# Patient Record
Sex: Male | Born: 1960 | Race: Black or African American | Hispanic: No | Marital: Married | Smoking: Never smoker
Health system: Southern US, Community
[De-identification: ages and names within clinical notes are randomized; demographics above are authoritative.]

## PROBLEM LIST (undated history)

## (undated) DIAGNOSIS — I499 Cardiac arrhythmia, unspecified: Secondary | ICD-10-CM

## (undated) DIAGNOSIS — I10 Essential (primary) hypertension: Secondary | ICD-10-CM

## (undated) HISTORY — DX: Cardiac arrhythmia, unspecified: I49.9

## (undated) HISTORY — PX: BACK SURGERY: SHX140

---

## 2016-08-19 NOTE — Patient Instructions (Addendum)
Marco Chapman  08/19/2016   Your procedure is scheduled on: 08-27-16   Report to [facility]  Entrance Take Marco Chapman Elevators to 3rd floor to Short Stay Center at 05:30 AM.    Call this number if you have problems the morning of surgery [phone]    Remember: ONLY 1 PERSON MAY GO WITH YOU TO SHORT STAY TO GET  READY MORNING OF YOUR SURGERY.  Do not eat food or drink liquids :After Midnight.     Take these medicines the morning of surgery with A SIP OF WATER: None                                 You may not have any metal on your body including hair pins and              piercings  Do not wear jewelry, make-up, lotions, powders or perfumes, deodorant             Men may shave face and neck.   Do not bring valuables to the hospital. [place] IS NOT             RESPONSIBLE   FOR VALUABLES.  Contacts, dentures or bridgework may not be worn into surgery.  Leave suitcase in the car. After surgery it may be brought to your room.     Please read over the following fact sheets you were given: _____________________________________________________________________  Marco Chapman - Preparing for Surgery Before surgery, you can play an important role.  Because skin is not sterile, your skin needs to be as free of germs as possible.  You can reduce the number of germs on your skin by washing with CHG (chlorahexidine gluconate) soap before surgery.  CHG is an antiseptic cleaner which kills germs and bonds with the skin to continue killing germs even after washing. Please DO NOT use if you have an allergy to CHG or antibacterial soaps.  If your skin becomes reddened/irritated stop using the CHG and inform your nurse when you arrive at Short Stay. Do not shave (including legs and underarms) for at least 48 hours prior to the first CHG shower.  You may shave your face/neck. Please follow these instructions carefully:  1.  Shower with CHG Soap the night before surgery  and the  morning of Surgery.  2.  If you choose to wash your hair, wash your hair first as usual with your  normal  shampoo.  3.  After you shampoo, rinse your hair and body thoroughly to remove the  shampoo.                           4.  Use CHG as you would any other liquid soap.  You can apply chg directly  to the skin and wash                       Gently with a scrungie or clean washcloth.  5.  Apply the CHG Soap to your body ONLY FROM THE NECK DOWN.   Do not use on face/ open                           Wound or open sores. Avoid contact with  eyes, ears mouth and genitals (private parts).                       Wash face,  Genitals (private parts) with your normal soap.             6.  Wash thoroughly, paying special attention to the area where your surgery  will be performed.  7.  Thoroughly rinse your body with warm water from the neck down.  8.  DO NOT shower/wash with your normal soap after using and rinsing off  the CHG Soap.                9.  Pat yourself dry with a clean towel.            10.  Wear clean pajamas.            11.  Place clean sheets on your bed the night of your first shower and do not  sleep with pets. Day of Surgery : Do not apply any lotions/deodorants the morning of surgery.  Please wear clean clothes to the hospital/surgery center.  FAILURE TO FOLLOW THESE INSTRUCTIONS MAY RESULT IN THE CANCELLATION OF YOUR SURGERY PATIENT SIGNATURE_________________________________  NURSE SIGNATURE__________________________________  ________________________________________________________________________         Marco MireIncentive Spirometer  An incentive spirometer is a tool that can help keep your lungs clear and active. This tool measures how well you are filling your lungs with each breath. Taking long deep breaths may help reverse or decrease the chance of developing breathing (pulmonary) problems (especially infection) following:  A long period of time when you are unable to  move or be active. BEFORE THE PROCEDURE   If the spirometer includes an indicator to show your best effort, your nurse or respiratory therapist will set it to a desired goal.  If possible, sit up straight or lean slightly forward. Try not to slouch.  Hold the incentive spirometer in an upright position. INSTRUCTIONS FOR USE  1. Sit on the edge of your bed if possible, or sit up as far as you can in bed or on a chair. 2. Hold the incentive spirometer in an upright position. 3. Breathe out normally. 4. Place the mouthpiece in your mouth and seal your lips tightly around it. 5. Breathe in slowly and as deeply as possible, raising the piston or the ball toward the top of the column. 6. Hold your breath for 3-5 seconds or for as long as possible. Allow the piston or ball to fall to the bottom of the column. 7. Remove the mouthpiece from your mouth and breathe out normally. 8. Rest for a few seconds and repeat Steps 1 through 7 at least 10 times every 1-2 hours when you are awake. Take your time and take a few normal breaths between deep breaths. 9. The spirometer may include an indicator to show your best effort. Use the indicator as a goal to work toward during each repetition. 10. After each set of 10 deep breaths, practice coughing to be sure your lungs are clear. If you have an incision (the cut made at the time of surgery), support your incision when coughing by placing a pillow or rolled up towels firmly against it. Once you are able to get out of bed, walk around indoors and cough well. You may stop using the incentive spirometer when instructed by your caregiver.  RISKS AND COMPLICATIONS  Take your time so you do not get dizzy  or light-headed.  If you are in pain, you may need to take or ask for pain medication before doing incentive spirometry. It is harder to take a deep breath if you are having pain. AFTER USE  Rest and breathe slowly and easily.  It can be helpful to keep track of  a log of your progress. Your caregiver can provide you with a simple table to help with this. If you are using the spirometer at home, follow these instructions: SEEK MEDICAL CARE IF:   You are having difficultly using the spirometer.  You have trouble using the spirometer as often as instructed.  Your pain medication is not giving enough relief while using the spirometer.  You develop fever of 100.5 F (38.1 C) or higher. SEEK IMMEDIATE MEDICAL CARE IF:   You cough up bloody sputum that had not been present before.  You develop fever of 102 F (38.9 C) or greater.  You develop worsening pain at or near the incision site. MAKE SURE YOU:   Understand these instructions.  Will watch your condition.  Will get help right away if you are not doing well or get worse. Document Released: 07/19/2006 Document Revised: 05/31/2011 Document Reviewed: 09/19/2006 [facility] Patient Information 2014 [place], [place].   ________________________________________________________________________

## 2016-08-20 ENCOUNTER — Encounter (HOSPITAL_COMMUNITY)
Admission: RE | Admit: 2016-08-20 | Discharge: 2016-08-20 | Disposition: A | Discharge status: Home / Self Care | Payer: BLUE CROSS/BLUE SHIELD | Source: Ambulatory Visit | Attending: Orthopedic Surgery | Admitting: Orthopedic Surgery

## 2016-08-20 ENCOUNTER — Encounter (HOSPITAL_COMMUNITY): Payer: Self-pay

## 2016-08-20 ENCOUNTER — Ambulatory Visit (HOSPITAL_COMMUNITY)
Admission: RE | Admit: 2016-08-20 | Discharge: 2016-08-20 | Disposition: A | Discharge status: Home / Self Care | Payer: BLUE CROSS/BLUE SHIELD | Source: Ambulatory Visit | Attending: Surgical | Admitting: Surgical

## 2016-08-20 DIAGNOSIS — M5126 Other intervertebral disc displacement, lumbar region: Secondary | ICD-10-CM | POA: Diagnosis not present

## 2016-08-20 DIAGNOSIS — Z01818 Encounter for other preprocedural examination: Secondary | ICD-10-CM | POA: Insufficient documentation

## 2016-08-20 DIAGNOSIS — M545 Low back pain, unspecified: Secondary | ICD-10-CM

## 2016-08-20 DIAGNOSIS — M5136 Other intervertebral disc degeneration, lumbar region: Secondary | ICD-10-CM | POA: Diagnosis not present

## 2016-08-20 DIAGNOSIS — I517 Cardiomegaly: Secondary | ICD-10-CM | POA: Diagnosis not present

## 2016-08-20 DIAGNOSIS — M48061 Spinal stenosis, lumbar region without neurogenic claudication: Secondary | ICD-10-CM | POA: Insufficient documentation

## 2016-08-20 LAB — URINALYSIS, ROUTINE W REFLEX MICROSCOPIC
Bilirubin Urine: NEGATIVE
Glucose, UA: NEGATIVE mg/dL
Hgb urine dipstick: NEGATIVE
Ketones, ur: NEGATIVE mg/dL
Leukocytes, UA: NEGATIVE
Nitrite: NEGATIVE
Protein, ur: NEGATIVE mg/dL
Specific Gravity, Urine: 1.012 (ref 1.005–1.030)
pH: 5 (ref 5.0–8.0)

## 2016-08-20 LAB — COMPREHENSIVE METABOLIC PANEL
ALT: 31 U/L (ref 17–63)
AST: 28 U/L (ref 15–41)
Albumin: 4.1 g/dL (ref 3.5–5.0)
Alkaline Phosphatase: 59 U/L (ref 38–126)
Anion gap: 8 (ref 5–15)
BUN: 12 mg/dL (ref 6–20)
CO2: 29 mmol/L (ref 22–32)
Calcium: 9.4 mg/dL (ref 8.9–10.3)
Chloride: 103 mmol/L (ref 101–111)
Creatinine, Ser: 1.08 mg/dL (ref 0.61–1.24)
GFR calc Af Amer: >60 mL/min (ref >60)
GFR calc non Af Amer: >60 mL/min (ref >60)
Glucose, Bld: 99 mg/dL (ref 65–99)
Potassium: 4.1 mmol/L (ref 3.5–5.1)
Sodium: 140 mmol/L (ref 135–145)
Total Bilirubin: 0.5 mg/dL (ref 0.3–1.2)
Total Protein: 7.6 g/dL (ref 6.5–8.1)

## 2016-08-20 LAB — CBC WITH DIFFERENTIAL/PLATELET
Basophils Absolute: 0 10*3/uL (ref 0.0–0.1)
Basophils Relative: 1 %
Eosinophils Absolute: 0.2 10*3/uL (ref 0.0–0.7)
Eosinophils Relative: 4 %
HCT: 44.1 % (ref 39.0–52.0)
Hemoglobin: 14.8 g/dL (ref 13.0–17.0)
Lymphocytes Relative: 43 %
Lymphs Abs: 1.9 10*3/uL (ref 0.7–4.0)
MCH: 29.8 pg (ref 26.0–34.0)
MCHC: 33.6 g/dL (ref 30.0–36.0)
MCV: 88.9 fL (ref 78.0–100.0)
Monocytes Absolute: 0.6 10*3/uL (ref 0.1–1.0)
Monocytes Relative: 14 %
Neutro Abs: 1.6 10*3/uL — ABNORMAL LOW (ref 1.7–7.7)
Neutrophils Relative %: 38 %
Platelets: 184 10*3/uL (ref 150–400)
RBC: 4.96 MIL/uL (ref 4.22–5.81)
RDW: 13 % (ref 11.5–15.5)
WBC: 4.3 10*3/uL (ref 4.0–10.5)

## 2016-08-20 LAB — PROTIME-INR
INR: 0.94
Prothrombin Time: 12.5 seconds (ref 11.4–15.2)

## 2016-08-20 LAB — APTT: aPTT: 27 seconds (ref 24–36)

## 2016-08-20 LAB — SURGICAL PCR SCREEN
MRSA, PCR: NEGATIVE
Staphylococcus aureus: NEGATIVE

## 2016-08-26 ENCOUNTER — Encounter (HOSPITAL_COMMUNITY): Payer: Self-pay | Admitting: Anesthesiology

## 2016-08-26 NOTE — Anesthesia Preprocedure Evaluation (Addendum)
Anesthesia Evaluation  Patient identified by MRN, date of birth, ID band Patient awake    Reviewed: Allergy & Precautions, NPO status , Patient's Chart, lab work & pertinent test results  Airway Mallampati: I       Dental no notable dental hx. (+) Teeth Intact   Pulmonary    Pulmonary exam normal breath sounds clear to auscultation       Cardiovascular hypertension, Pt. on medications Normal cardiovascular exam Rhythm:Regular Rate:Normal     Neuro/Psych negative neurological ROS  negative psych ROS   GI/Hepatic negative GI ROS, Neg liver ROS,   Endo/Other  negative endocrine ROS  Renal/GU negative Renal ROS     Musculoskeletal   Abdominal Normal abdominal exam  (+)   Peds  Hematology negative hematology ROS (+)   Anesthesia Other Findings   Reproductive/Obstetrics                            Anesthesia Physical Anesthesia Plan  ASA: II  Anesthesia Plan: General   Post-op Pain Management:    Induction: Intravenous  PONV Risk Score and Plan: 3 and Ondansetron, Dexamethasone, Propofol, Midazolam and Treatment may vary due to age  Airway Management Planned: Oral ETT  Additional Equipment:   Intra-op Plan:   Post-operative Plan: Extubation in OR  Informed Consent: I have reviewed the patients History and Physical, chart, labs and discussed the procedure including the risks, benefits and alternatives for the proposed anesthesia with the patient or authorized representative who has indicated his/her understanding and acceptance.   Dental advisory given  Plan Discussed with: CRNA and Surgeon  Anesthesia Plan Comments:        Anesthesia Quick Evaluation

## 2016-08-27 ENCOUNTER — Encounter (HOSPITAL_COMMUNITY): Payer: Self-pay

## 2016-08-27 ENCOUNTER — Observation Stay (HOSPITAL_COMMUNITY)
Admission: RE | Admit: 2016-08-27 | Discharge: 2016-08-28 | Disposition: A | Discharge status: Home / Self Care | Payer: BLUE CROSS/BLUE SHIELD | Source: Ambulatory Visit | Attending: Orthopedic Surgery | Admitting: Orthopedic Surgery

## 2016-08-27 ENCOUNTER — Ambulatory Visit (HOSPITAL_COMMUNITY): Payer: BLUE CROSS/BLUE SHIELD | Admitting: Anesthesiology

## 2016-08-27 ENCOUNTER — Ambulatory Visit (HOSPITAL_COMMUNITY): Payer: BLUE CROSS/BLUE SHIELD

## 2016-08-27 ENCOUNTER — Encounter (HOSPITAL_COMMUNITY)
Admission: RE | Disposition: A | Discharge status: Home / Self Care | Payer: Self-pay | Source: Ambulatory Visit | Attending: Orthopedic Surgery

## 2016-08-27 DIAGNOSIS — R531 Weakness: Secondary | ICD-10-CM | POA: Diagnosis not present

## 2016-08-27 DIAGNOSIS — M5127 Other intervertebral disc displacement, lumbosacral region: Secondary | ICD-10-CM | POA: Diagnosis not present

## 2016-08-27 DIAGNOSIS — M4807 Spinal stenosis, lumbosacral region: Secondary | ICD-10-CM | POA: Diagnosis not present

## 2016-08-27 DIAGNOSIS — Z79899 Other long term (current) drug therapy: Secondary | ICD-10-CM | POA: Insufficient documentation

## 2016-08-27 DIAGNOSIS — M79604 Pain in right leg: Secondary | ICD-10-CM | POA: Diagnosis present

## 2016-08-27 DIAGNOSIS — I10 Essential (primary) hypertension: Secondary | ICD-10-CM | POA: Insufficient documentation

## 2016-08-27 DIAGNOSIS — M48062 Spinal stenosis, lumbar region with neurogenic claudication: Secondary | ICD-10-CM | POA: Diagnosis present

## 2016-08-27 DIAGNOSIS — M549 Dorsalgia, unspecified: Secondary | ICD-10-CM

## 2016-08-27 HISTORY — PX: LUMBAR LAMINECTOMY/DECOMPRESSION MICRODISCECTOMY: SHX5026

## 2016-08-27 SURGERY — LUMBAR LAMINECTOMY/DECOMPRESSION MICRODISCECTOMY
Anesthesia: General | Site: Back

## 2016-08-27 MED ORDER — HYDROCODONE-ACETAMINOPHEN 5-325 MG PO TABS
1.0000 | ORAL_TABLET | ORAL | Status: DC | PRN
  Administered 2016-08-27 (×2): 1 via ORAL
  Filled 2016-08-27 (×2): qty 1

## 2016-08-27 MED ORDER — SODIUM CHLORIDE 0.9 % IR SOLN
Status: AC
  Filled 2016-08-27: qty 500000

## 2016-08-27 MED ORDER — POLYETHYLENE GLYCOL 3350 17 G PO PACK: 17.0000 g | PACK | Freq: Every day | ORAL | Status: DC | PRN

## 2016-08-27 MED ORDER — SUGAMMADEX SODIUM 200 MG/2ML IV SOLN
INTRAVENOUS | Status: AC
  Filled 2016-08-27: qty 2

## 2016-08-27 MED ORDER — ONDANSETRON HCL 4 MG/2ML IJ SOLN
INTRAMUSCULAR | Status: DC | PRN
  Administered 2016-08-27: 4 mg via INTRAVENOUS

## 2016-08-27 MED ORDER — ACETAMINOPHEN 650 MG RE SUPP: 650.0000 mg | RECTAL | Status: DC | PRN

## 2016-08-27 MED ORDER — LABETALOL HCL 5 MG/ML IV SOLN
INTRAVENOUS | Status: AC
  Filled 2016-08-27: qty 4

## 2016-08-27 MED ORDER — BACITRACIN ZINC 500 UNIT/GM EX OINT
TOPICAL_OINTMENT | CUTANEOUS | Status: AC
  Filled 2016-08-27: qty 28.35

## 2016-08-27 MED ORDER — BUPIVACAINE-EPINEPHRINE (PF) 0.5% -1:200000 IJ SOLN
INTRAMUSCULAR | Status: DC | PRN
  Administered 2016-08-27: 20 mL

## 2016-08-27 MED ORDER — CHLORHEXIDINE GLUCONATE 4 % EX LIQD: 60.0000 mL | Freq: Once | CUTANEOUS | Status: DC

## 2016-08-27 MED ORDER — PHENYLEPHRINE 40 MCG/ML (10ML) SYRINGE FOR IV PUSH (FOR BLOOD PRESSURE SUPPORT)
PREFILLED_SYRINGE | INTRAVENOUS | Status: AC
  Filled 2016-08-27: qty 10

## 2016-08-27 MED ORDER — ONDANSETRON HCL 4 MG/2ML IJ SOLN
INTRAMUSCULAR | Status: AC
  Filled 2016-08-27: qty 2

## 2016-08-27 MED ORDER — CEFAZOLIN SODIUM-DEXTROSE 1-4 GM/50ML-% IV SOLN
1.0000 g | Freq: Three times a day (TID) | INTRAVENOUS | Status: AC
  Administered 2016-08-27 – 2016-08-28 (×3): 1 g via INTRAVENOUS
  Filled 2016-08-27 (×3): qty 50

## 2016-08-27 MED ORDER — METHOCARBAMOL 500 MG PO TABS: 500.0000 mg | ORAL_TABLET | Freq: Three times a day (TID) | ORAL | 1 refills | Status: DC | PRN

## 2016-08-27 MED ORDER — ROCURONIUM BROMIDE 50 MG/5ML IV SOSY
PREFILLED_SYRINGE | INTRAVENOUS | Status: AC
  Filled 2016-08-27: qty 5

## 2016-08-27 MED ORDER — AMLODIPINE BESYLATE 10 MG PO TABS
10.0000 mg | ORAL_TABLET | Freq: Every evening | ORAL | Status: DC
  Administered 2016-08-27: 10 mg via ORAL
  Filled 2016-08-27: qty 1

## 2016-08-27 MED ORDER — HYDROMORPHONE HCL 1 MG/ML IJ SOLN: 0.2500 mg | INTRAMUSCULAR | Status: DC | PRN

## 2016-08-27 MED ORDER — DEXTROSE 5 % IV SOLN
500.0000 mg | Freq: Four times a day (QID) | INTRAVENOUS | Status: DC | PRN
  Administered 2016-08-27: 500 mg via INTRAVENOUS
  Filled 2016-08-27: qty 550

## 2016-08-27 MED ORDER — BISACODYL 5 MG PO TBEC: 5.0000 mg | DELAYED_RELEASE_TABLET | Freq: Every day | ORAL | Status: DC | PRN

## 2016-08-27 MED ORDER — ONDANSETRON HCL 4 MG PO TABS
4.0000 mg | ORAL_TABLET | Freq: Four times a day (QID) | ORAL | Status: DC | PRN
Start: 2016-08-27 — End: 2016-08-28

## 2016-08-27 MED ORDER — SODIUM CHLORIDE 0.9 % IR SOLN
Status: DC | PRN
  Administered 2016-08-27: 500 mL

## 2016-08-27 MED ORDER — MIDAZOLAM HCL 2 MG/2ML IJ SOLN
INTRAMUSCULAR | Status: DC | PRN
  Administered 2016-08-27: 2 mg via INTRAVENOUS

## 2016-08-27 MED ORDER — OXYCODONE-ACETAMINOPHEN 5-325 MG PO TABS
2.0000 | ORAL_TABLET | ORAL | Status: DC | PRN
  Administered 2016-08-28: 2 via ORAL
  Filled 2016-08-27: qty 2

## 2016-08-27 MED ORDER — FLEET ENEMA 7-19 GM/118ML RE ENEM: 1.0000 | ENEMA | Freq: Once | RECTAL | Status: DC | PRN

## 2016-08-27 MED ORDER — FENTANYL CITRATE (PF) 250 MCG/5ML IJ SOLN
INTRAMUSCULAR | Status: AC
  Filled 2016-08-27: qty 5

## 2016-08-27 MED ORDER — PHENYLEPHRINE HCL 10 MG/ML IJ SOLN
INTRAMUSCULAR | Status: DC | PRN
  Administered 2016-08-27 (×2): 80 ug via INTRAVENOUS
  Administered 2016-08-27: 40 ug via INTRAVENOUS

## 2016-08-27 MED ORDER — PROPOFOL 10 MG/ML IV BOLUS
INTRAVENOUS | Status: AC
  Filled 2016-08-27: qty 20

## 2016-08-27 MED ORDER — HYDROMORPHONE HCL 1 MG/ML IJ SOLN
0.5000 mg | INTRAMUSCULAR | Status: DC | PRN
  Administered 2016-08-28 (×2): 0.5 mg via INTRAVENOUS
  Filled 2016-08-27 (×2): qty 0.5

## 2016-08-27 MED ORDER — BUPIVACAINE LIPOSOME 1.3 % IJ SUSP
20.0000 mL | Freq: Once | INTRAMUSCULAR | Status: DC
  Filled 2016-08-27: qty 20

## 2016-08-27 MED ORDER — FENTANYL CITRATE (PF) 250 MCG/5ML IJ SOLN
INTRAMUSCULAR | Status: DC | PRN
  Administered 2016-08-27: 100 ug via INTRAVENOUS
  Administered 2016-08-27 (×3): 50 ug via INTRAVENOUS

## 2016-08-27 MED ORDER — ACETAMINOPHEN 10 MG/ML IV SOLN
INTRAVENOUS | Status: AC
  Filled 2016-08-27: qty 100

## 2016-08-27 MED ORDER — LIDOCAINE 2% (20 MG/ML) 5 ML SYRINGE
INTRAMUSCULAR | Status: AC
  Filled 2016-08-27: qty 5

## 2016-08-27 MED ORDER — SUCCINYLCHOLINE CHLORIDE 200 MG/10ML IV SOSY
PREFILLED_SYRINGE | INTRAVENOUS | Status: AC
  Filled 2016-08-27: qty 10

## 2016-08-27 MED ORDER — ROCURONIUM BROMIDE 50 MG/5ML IV SOSY
PREFILLED_SYRINGE | INTRAVENOUS | Status: DC | PRN
  Administered 2016-08-27: 10 mg via INTRAVENOUS
  Administered 2016-08-27: 50 mg via INTRAVENOUS

## 2016-08-27 MED ORDER — SUCCINYLCHOLINE CHLORIDE 200 MG/10ML IV SOSY
PREFILLED_SYRINGE | INTRAVENOUS | Status: DC | PRN
Start: 2016-08-27 — End: 2016-08-27
  Administered 2016-08-27: 120 mg via INTRAVENOUS

## 2016-08-27 MED ORDER — ACETAMINOPHEN 325 MG PO TABS: 650.0000 mg | ORAL_TABLET | ORAL | Status: DC | PRN

## 2016-08-27 MED ORDER — LACTATED RINGERS IV SOLN
INTRAVENOUS | Status: DC
  Administered 2016-08-27 (×2): via INTRAVENOUS

## 2016-08-27 MED ORDER — HYDROMORPHONE HCL 1 MG/ML IJ SOLN
INTRAMUSCULAR | Status: AC
  Filled 2016-08-27: qty 1

## 2016-08-27 MED ORDER — DEXAMETHASONE SODIUM PHOSPHATE 10 MG/ML IJ SOLN
INTRAMUSCULAR | Status: AC
  Filled 2016-08-27: qty 1

## 2016-08-27 MED ORDER — ONDANSETRON HCL 4 MG/2ML IJ SOLN
4.0000 mg | Freq: Four times a day (QID) | INTRAMUSCULAR | Status: DC | PRN
Start: 2016-08-27 — End: 2016-08-28

## 2016-08-27 MED ORDER — CEFAZOLIN SODIUM-DEXTROSE 2-4 GM/100ML-% IV SOLN
2.0000 g | INTRAVENOUS | Status: AC
  Administered 2016-08-27: 2 g via INTRAVENOUS

## 2016-08-27 MED ORDER — MIDAZOLAM HCL 2 MG/2ML IJ SOLN
INTRAMUSCULAR | Status: AC
  Filled 2016-08-27: qty 2

## 2016-08-27 MED ORDER — ACETAMINOPHEN 10 MG/ML IV SOLN
1000.0000 mg | Freq: Once | INTRAVENOUS | Status: AC
  Administered 2016-08-27: 1000 mg via INTRAVENOUS

## 2016-08-27 MED ORDER — LISINOPRIL 5 MG PO TABS
5.0000 mg | ORAL_TABLET | Freq: Every evening | ORAL | Status: DC
  Administered 2016-08-27: 5 mg via ORAL
  Filled 2016-08-27: qty 1

## 2016-08-27 MED ORDER — MEPERIDINE HCL 50 MG/ML IJ SOLN: 6.2500 mg | INTRAMUSCULAR | Status: DC | PRN

## 2016-08-27 MED ORDER — KETOROLAC TROMETHAMINE 30 MG/ML IJ SOLN: 30.0000 mg | Freq: Once | INTRAMUSCULAR | Status: DC | PRN

## 2016-08-27 MED ORDER — DEXAMETHASONE SODIUM PHOSPHATE 10 MG/ML IJ SOLN
INTRAMUSCULAR | Status: DC | PRN
  Administered 2016-08-27: 10 mg via INTRAVENOUS

## 2016-08-27 MED ORDER — OXYCODONE-ACETAMINOPHEN 5-325 MG PO TABS: 1.0000 | ORAL_TABLET | ORAL | 0 refills | Status: DC | PRN

## 2016-08-27 MED ORDER — MENTHOL 3 MG MT LOZG: 1.0000 | LOZENGE | OROMUCOSAL | Status: DC | PRN

## 2016-08-27 MED ORDER — PROPOFOL 10 MG/ML IV BOLUS
INTRAVENOUS | Status: DC | PRN
  Administered 2016-08-27: 200 mg via INTRAVENOUS

## 2016-08-27 MED ORDER — PROMETHAZINE HCL 25 MG/ML IJ SOLN: 6.2500 mg | INTRAMUSCULAR | Status: DC | PRN

## 2016-08-27 MED ORDER — PHENOL 1.4 % MT LIQD: 1.0000 | OROMUCOSAL | Status: DC | PRN

## 2016-08-27 MED ORDER — CEFAZOLIN SODIUM-DEXTROSE 2-4 GM/100ML-% IV SOLN
INTRAVENOUS | Status: AC
  Filled 2016-08-27: qty 100

## 2016-08-27 MED ORDER — METHOCARBAMOL 500 MG PO TABS: 500.0000 mg | ORAL_TABLET | Freq: Four times a day (QID) | ORAL | Status: DC | PRN

## 2016-08-27 MED ORDER — BUPIVACAINE-EPINEPHRINE (PF) 0.5% -1:200000 IJ SOLN
INTRAMUSCULAR | Status: AC
  Filled 2016-08-27: qty 30

## 2016-08-27 MED ORDER — LIDOCAINE 2% (20 MG/ML) 5 ML SYRINGE
INTRAMUSCULAR | Status: DC | PRN
  Administered 2016-08-27: 100 mg via INTRAVENOUS

## 2016-08-27 MED ORDER — LACTATED RINGERS IV SOLN
INTRAVENOUS | Status: DC
  Administered 2016-08-27 (×2): via INTRAVENOUS

## 2016-08-27 MED ORDER — BUPIVACAINE LIPOSOME 1.3 % IJ SUSP
INTRAMUSCULAR | Status: DC | PRN
  Administered 2016-08-27: 20 mL

## 2016-08-27 MED ORDER — SUGAMMADEX SODIUM 200 MG/2ML IV SOLN
INTRAVENOUS | Status: DC | PRN
  Administered 2016-08-27: 200 mg via INTRAVENOUS

## 2016-08-27 SURGICAL SUPPLY — 47 items
BAG ZIPLOCK 12X15 (MISCELLANEOUS) ×3 IMPLANT
BENZOIN TINCTURE PRP APPL 2/3 (GAUZE/BANDAGES/DRESSINGS) ×3 IMPLANT
CLEANER TIP ELECTROSURG 2X2 (MISCELLANEOUS) ×3 IMPLANT
COVER SURGICAL LIGHT HANDLE (MISCELLANEOUS) ×3 IMPLANT
DRAIN PENROSE 18X1/4 LTX STRL (WOUND CARE) IMPLANT
DRAPE MICROSCOPE LEICA (MISCELLANEOUS) ×3 IMPLANT
DRAPE POUCH INSTRU U-SHP 10X18 (DRAPES) IMPLANT
DRAPE SHEET LG 3/4 BI-LAMINATE (DRAPES) ×3 IMPLANT
DRAPE SURG 17X11 SM STRL (DRAPES) ×3 IMPLANT
DRSG ADAPTIC 3X8 NADH LF (GAUZE/BANDAGES/DRESSINGS) ×3 IMPLANT
DRSG PAD ABDOMINAL 8X10 ST (GAUZE/BANDAGES/DRESSINGS) ×12 IMPLANT
DURAPREP 26ML APPLICATOR (WOUND CARE) ×3 IMPLANT
ELECT BLADE TIP CTD 4 INCH (ELECTRODE) ×3 IMPLANT
ELECT REM PT RETURN 15FT ADLT (MISCELLANEOUS) ×3 IMPLANT
GAUZE SPONGE 4X4 12PLY STRL (GAUZE/BANDAGES/DRESSINGS) ×3 IMPLANT
GLOVE BIOGEL PI IND STRL 6.5 (GLOVE) ×1 IMPLANT
GLOVE BIOGEL PI IND STRL 8.5 (GLOVE) ×1 IMPLANT
GLOVE BIOGEL PI INDICATOR 6.5 (GLOVE) ×2
GLOVE BIOGEL PI INDICATOR 8.5 (GLOVE) ×2
GLOVE ECLIPSE 8.0 STRL XLNG CF (GLOVE) ×6 IMPLANT
GLOVE INDICATOR 6.5 STRL GRN (GLOVE) ×3 IMPLANT
GOWN STRL REUS W/ TWL LRG LVL3 (GOWN DISPOSABLE) ×1 IMPLANT
GOWN STRL REUS W/TWL LRG LVL3 (GOWN DISPOSABLE) ×2
GOWN STRL REUS W/TWL XL LVL3 (GOWN DISPOSABLE) ×3 IMPLANT
HEMOSTAT SPONGE AVITENE ULTRA (HEMOSTASIS) ×3 IMPLANT
KIT BASIN OR (CUSTOM PROCEDURE TRAY) ×3 IMPLANT
KIT POSITIONING SURG ANDREWS (MISCELLANEOUS) ×3 IMPLANT
MANIFOLD NEPTUNE II (INSTRUMENTS) ×3 IMPLANT
MARKER SKIN DUAL TIP RULER LAB (MISCELLANEOUS) IMPLANT
NEEDLE HYPO 22GX1.5 SAFETY (NEEDLE) ×3 IMPLANT
NEEDLE SPNL 18GX3.5 QUINCKE PK (NEEDLE) ×9 IMPLANT
PACK LAMINECTOMY ORTHO (CUSTOM PROCEDURE TRAY) ×3 IMPLANT
PATTIES SURGICAL .5 X.5 (GAUZE/BANDAGES/DRESSINGS) IMPLANT
PATTIES SURGICAL .75X.75 (GAUZE/BANDAGES/DRESSINGS) ×3 IMPLANT
PATTIES SURGICAL 1X1 (DISPOSABLE) IMPLANT
PIN SAFETY NICK PLATE  2 MED (MISCELLANEOUS)
PIN SAFETY NICK PLATE 2 MED (MISCELLANEOUS) IMPLANT
SPONGE LAP 4X18 X RAY DECT (DISPOSABLE) ×6 IMPLANT
STAPLER VISISTAT 35W (STAPLE) ×3 IMPLANT
SUT VIC AB 0 CT1 27 (SUTURE) ×2
SUT VIC AB 0 CT1 27XBRD ANTBC (SUTURE) ×1 IMPLANT
SUT VIC AB 1 CT1 27 (SUTURE) ×6
SUT VIC AB 1 CT1 27XBRD ANTBC (SUTURE) ×3 IMPLANT
SUT VIC AB 2-0 CT1 27 (SUTURE) ×2
SUT VIC AB 2-0 CT1 TAPERPNT 27 (SUTURE) ×1 IMPLANT
SYR 20CC LL (SYRINGE) ×3 IMPLANT
TOWEL OR 17X26 10 PK STRL BLUE (TOWEL DISPOSABLE) ×3 IMPLANT

## 2016-08-27 NOTE — Anesthesia Procedure Notes (Signed)
Procedure Name: Intubation Date/Time: 08/27/2016 7:27 AM Performed by: Dione Booze Pre-anesthesia Checklist: Emergency Drugs available, Suction available, Patient being monitored and Patient identified Patient Re-evaluated:Patient Re-evaluated prior to inductionOxygen Delivery Method: Circle system utilized Preoxygenation: Pre-oxygenation with 100% oxygen Intubation Type: IV induction Ventilation: Mask ventilation without difficulty Laryngoscope Size: Mac and 4 Grade View: Grade II Tube type: Oral Tube size: 7.5 mm Number of attempts: 1 Airway Equipment and Method: Stylet Placement Confirmation: ETT inserted through vocal cords under direct vision,  positive ETCO2 and breath sounds checked- equal and bilateral Secured at: 23 cm Tube secured with: Tape Dental Injury: Teeth and Oropharynx as per pre-operative assessment

## 2016-08-27 NOTE — Discharge Instructions (Signed)
For the first fhree days, remove your dressing, tape a piece of saran wrap over your incision,. Take your shower, then remove the saran wrap and put a clean dressing on. After three days you can shower without the saran wrap.  No lifting or bending. No driving while taking pain medications. Call Dr. [name] if any wound complications or temperature of 101 degrees F or over.  Call the office for an appointment to see Dr. [name] in two weeks: [phone] and ask for Dr. Jeannetta EllisGioffre's nurse, Mackey Birchwoodammy Johnson.

## 2016-08-27 NOTE — Op Note (Signed)
NAMEWALKER, Marco Chapman             ACCOUNT NO.:  [id number]  MEDICAL RECORD NO.:  [id number]  LOCATION:  [facility]                         FACILITY:  [facility]  PHYSICIAN:  Georges Lynch. Dekisha Mesmer, M.D.DATE OF BIRTH:  [birth year]  DATE OF PROCEDURE:  08/27/2016 DATE OF DISCHARGE:                              OPERATIVE REPORT   SURGEON:  Georges Lynch. Darrelyn Hillock, M.D.  OPERATIVE ASSISTANT:  Dimitri Ped, PA.  PREOPERATIVE DIAGNOSES: 1. Spinal stenosis at L5-S1. 2. 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 3. 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  Note, all of his     symptoms were on the right. 4. Partial weakness of the extensors of the right foot.  DESCRIPTION OF PROCEDURE:  Under general anesthesia, routine orthopedic prep and draping of the lower back is carried out with the patient on spinal frame.  The appropriate time-out was carried out, also marked the right side of his back in the holding area.  At this time, he had 2 g of IV Ancef.  Two needles were then placed in the back for localization purposes.  X-ray was taken.  Following that, an incision was made over the L5-S1 interspace.  I went central at this time.  I then separated the muscle from the lamina and spinous process bilaterally.  A Kocher clamp was placed on the spinous process, x-ray was taken.  Following that, I then inserted McCullough retractors.  I went down and cleared the interlaminar space on both sides.  I then began a central decompressive lumbar laminectomy in the usual fashion.  The facets were preserved.  At this time, then the microscope was brought in.  I then carried out my laminectomy mainly to the right.  I went out and decompressed the lateral recess, also went down and did a foraminotomy for the S1 root.  At this time, I gently retracted the dura, identified the S1 root, gradually retracted that as well.  I identified the disc space at L5-S1.  Another x-ray was taken.  At this point, we  then utilized a bipolar and cauterized lateral recess veins.  I then placed a needle in the disc space, removed that.  I then made a cruciate incision in the posterior longitudinal ligament and did a microdiskectomy in the usual fashion.  I continued the microdiskectomy until there was no further disk material were able to be removed.  I then went up under the root of S1 and up above L5 and we now had complete decompression.  The dura and root were easily movable.  I thoroughly irrigated out the area, loosely applied some Ultrafoam and Gelfoam and closed the wound in layers in the usual fashion.  At the beginning of the case, I injected 20 mL of 0.5% Marcaine with epinephrine to control bleeding.  At the end of the case, I injected 20 mL of Exparel into the soft tissue.  When I closed the deep part of the wound, I left a small proximal and distal part of the deep part of the wound for drainage purposes.  Subcu was closed in usual fashion.  Skin was closed with metal staples.  Sterile dressings were applied.  The patient  left the operating room in satisfactory condition.  He will be admitted overnight.          ______________________________ Georges Lynchonald A. [name], M.D.     RAG/MEDQ  D:  08/27/2016  T:  08/27/2016  Job:  [id number]

## 2016-08-27 NOTE — Brief Op Note (Signed)
08/27/2016  8:47 AM  PATIENT:  Dorise Hissurtis L Schow  56 y.o. male  PRE-OPERATIVE DIAGNOSIS:  Right L5-S1 HNPand Spinal Stenosis and Foraminal Stenosis of the S-1 Nerve Root.  POST-OPERATIVE DIAGNOSIS:Same as Pre-Op  PROCEDURE:  Procedure(s): Decompression lumbar laminectomy L5-S1 right and microdisectomy L5-S1 right , forninotomy S1 root on the right (N/A)for SPINAL stenosis and HNP.  SURGEON:  Surgeon(s) and Role:    * Ranee GosselinGioffre, Demontae Antunes, MD - Primary  PHYSICIAN ASSISTANT: [name] PA  ASSISTANTS: [name] PA  ANESTHESIA:   general  EBL:  Total I/O In: -  Out: 25 [Blood:25]  BLOOD ADMINISTERED:none  DRAINS: none   LOCAL MEDICATIONS USED:  MARCAINE  20cc of 0.5% with Epinephrine at start of the case and 20cc of Exparel at the end of the case.   SPECIMEN:  No Specimen  DISPOSITION OF SPECIMEN:  N/A  COUNTS:  YES  TOURNIQUET:  * No tourniquets in log *  DICTATION: .Other Dictation: Dictation Number [id number]  PLAN OF CARE: Admit for overnight observation  PATIENT DISPOSITION:  PACU - hemodynamically stable.   Delay start of Pharmacological VTE agent (>24hrs) due to surgical blood loss or risk of bleeding: yes

## 2016-08-27 NOTE — Interval H&P Note (Signed)
History and Physical Interval Note:  08/27/2016 7:17 AM  Marco Chapman  has presented today for surgery, with the diagnosis of Right L5-S1 HNP  The various methods of treatment have been discussed with the patient and family. After consideration of risks, benefits and other options for treatment, the patient has consented to  Procedure(s): Decompression lumbar laminectomy L5-S1 right and microdisectomy L5-S1 right (N/A) as a surgical intervention .  The patient's history has been reviewed, patient examined, no change in status, stable for surgery.  I have reviewed the patient's chart and labs.  Questions were answered to the patient's satisfaction.     [name] A   

## 2016-08-27 NOTE — H&P (Signed)
Marco Chapman is an 56 y.o. male.   Chief Complaint: Right leg Pain. HPI: Progressive pain in his right leg with weakness.  History reviewed. No pertinent past medical history.  History reviewed. No pertinent surgical history.  History reviewed. No pertinent family history. Social History:  reports that he has never smoked. He has never used smokeless tobacco. He reports that he drinks alcohol. He reports that he does not use drugs.  Allergies: No Known Allergies  Medications Prior to Admission  Medication Sig Dispense Refill  . amLODipine (NORVASC) 10 MG tablet Take 10 mg by mouth every evening.    . calcium carbonate (TUMS - DOSED IN MG ELEMENTAL CALCIUM) 500 MG chewable tablet Chew 2 tablets by mouth daily as needed for indigestion or heartburn.    . Echinacea 400 MG CAPS Take 400 mg by mouth 3 (three) times daily.    . Ibuprofen-Famotidine (DUEXIS) 800-26.6 MG TABS Take 1 tablet by mouth 3 (three) times daily as needed. Pain    . lisinopril (PRINIVIL,ZESTRIL) 5 MG tablet Take 5 mg by mouth every evening.    . Multiple Vitamin (MULTIVITAMIN WITH MINERALS) TABS tablet Take 1 tablet by mouth daily.      No results found for this or any previous visit (from the past 48 hour(s)). No results found.  Review of Systems  Constitutional: Negative.   HENT: Negative.   Eyes: Negative.   Respiratory: Negative.   Cardiovascular: Negative.   Gastrointestinal: Negative.   Genitourinary: Negative.   Musculoskeletal: Positive for back pain.  Skin: Negative.   Neurological: Positive for focal weakness.  Endo/Heme/Allergies: Negative.   Psychiatric/Behavioral: Negative.     Blood pressure (!) 144/100, pulse 97, temperature 98.4 F (36.9 C), temperature source Oral, resp. rate 18, height 5\' 11"  (1.803 m), weight 93.4 kg (206 lb), SpO2 100 %. Physical Exam  Constitutional: He appears well-developed.  HENT:  Head: Normocephalic.  Eyes: Pupils are equal, round, and reactive to light.   Neck: Normal range of motion.  Cardiovascular: Normal rate.   Respiratory: Effort normal.  GI: Soft.  Musculoskeletal: He exhibits tenderness.  Neurological:  Weakness of right foot Dorsiflexors.  Skin: Skin is warm.  Psychiatric: He has a normal mood and affect.     Assessment/Plan Decompressive Lumbar Laminectomy and Microdiscectomy at L-5-S-1 on the right.  Jacki ConesGIOFFRE,Madalina Rosman A, MD 08/27/2016, 7:13 AM

## 2016-08-27 NOTE — Interval H&P Note (Signed)
History and Physical Interval Note:  08/27/2016 7:17 AM  Marco Chapman  has presented today for surgery, with the diagnosis of Right L5-S1 HNP  The various methods of treatment have been discussed with the patient and family. After consideration of risks, benefits and other options for treatment, the patient has consented to  Procedure(s): Decompression lumbar laminectomy L5-S1 right and microdisectomy L5-S1 right (N/A) as a surgical intervention .  The patient's history has been reviewed, patient examined, no change in status, stable for surgery.  I have reviewed the patient's chart and labs.  Questions were answered to the patient's satisfaction.     Io Dieujuste A

## 2016-08-27 NOTE — Anesthesia Postprocedure Evaluation (Signed)
Anesthesia Post Note  Patient: [name] L Clavel  Procedure(s) Performed: Procedure(s) (LRB): Decompression lumbar laminectomy L5-S1 right and microdisectomy L5-S1 right , forninotomy S1 root on the right (N/A)     Patient location during evaluation: PACU Anesthesia Type: General Level of consciousness: awake Pain management: pain level controlled Vital Signs Assessment: post-procedure vital signs reviewed and stable Respiratory status: spontaneous breathing Cardiovascular status: stable Postop Assessment: no signs of nausea or vomiting Anesthetic complications: no    Last Vitals:  Vitals:   08/27/16 0945 08/27/16 1000  BP: 126/85 (!) 135/97  Pulse: 85 87  Resp: 13 11  Temp:      Last Pain:  Vitals:   08/27/16 1000  TempSrc:   PainSc: 0-No pain   Pain Goal: Patients Stated Pain Goal: 4 (08/27/16 0602)    LLE Sensation: Full sensation (08/27/16 1000)   RLE Sensation: Full sensation (08/27/16 1000)      [name],[name]

## 2016-08-27 NOTE — Evaluation (Signed)
Physical Therapy Evaluation Patient Details Name: MAZI BRAILSFORD MRN: [id number] DOB: 10/27/1960 Today's Date: 08/27/2016   History of Present Illness  Pt s/p L5-S1 lumbar decompression and microdisectomy.    Clinical Impression  Pt s/p back surgery and presents with functional mobility limitations 2* post op pain and back precautions.  Pt should progress to dc home with family assist.    Follow Up Recommendations No PT follow up    Equipment Recommendations  Other (comment) (TBD)    Recommendations for Other Services OT consult     Precautions / Restrictions Precautions Precautions: Back Precaution Booklet Issued: Yes (comment) Precaution Comments: Reviewed back precautions x 2 Restrictions Weight Bearing Restrictions: No      Mobility  Bed Mobility Overal bed mobility: Needs Assistance Bed Mobility: Supine to Sit     Supine to sit: Min assist     General bed mobility comments: cues for correct log roll technique  Transfers Overall transfer level: Needs assistance Equipment used: Rolling walker (2 wheeled) Transfers: Sit to/from Stand Sit to Stand: Min assist         General transfer comment: cues for transition position, adherence to back precautions and use of UEs to self assist  Ambulation/Gait Ambulation/Gait assistance: Min assist;Min guard Ambulation Distance (Feet): 120 Feet Assistive device: Rolling walker (2 wheeled) Gait Pattern/deviations: Step-through pattern;Decreased step length - right;Decreased step length - left;Shuffle Gait velocity: decr Gait velocity interpretation: Below normal speed for age/gender General Gait Details: cues for posture and position from [employer]            Wheelchair Mobility    Modified Rankin (Stroke Patients Only)       Balance Overall balance assessment: Needs assistance Sitting-balance support: No upper extremity supported;Feet supported Sitting balance-Leahy Scale: Good     Standing balance  support: No upper extremity supported Standing balance-Leahy Scale: Fair                               Pertinent Vitals/Pain Pain Assessment: 0-10 Pain Score: 2  Pain Location: back Pain Descriptors / Indicators: Aching;Sore Pain Intervention(s): Limited activity within patient's tolerance;Monitored during session;Premedicated before session;Ice applied    Home Living Family/patient expects to be discharged to:: Private residence Living Arrangements: Spouse/significant other Available Help at Discharge: Family Type of Home: House Home Access: Stairs to enter (from garage) Entrance Stairs-Rails: None Secretary/administrator of Steps: 2 Home Layout: Two level;Able to live on main level with bedroom/bathroom Home Equipment: None      Prior Function Level of Independence: Independent               Hand Dominance        Extremity/Trunk Assessment   Upper Extremity Assessment Upper Extremity Assessment: Overall WFL for tasks assessed    Lower Extremity Assessment Lower Extremity Assessment: RLE deficits/detail RLE Deficits / Details: Pt reports R LE pain with ambulation    Cervical / Trunk Assessment Cervical / Trunk Assessment: Normal  Communication   Communication: No difficulties  Cognition Arousal/Alertness: Awake/alert Behavior During Therapy: WFL for tasks assessed/performed Overall Cognitive Status: Within Functional Limits for tasks assessed                                        General Comments      Exercises     Assessment/Plan  PT Assessment Patient needs continued PT services  PT Problem List Decreased activity tolerance;Decreased mobility;Decreased balance;Decreased knowledge of use of DME;Pain;Decreased knowledge of precautions       PT Treatment Interventions DME instruction;Gait training;Stair training;Functional mobility training;Therapeutic activities;Therapeutic exercise;Patient/family education    PT  Goals (Current goals can be found in the Care Plan section)  Acute Rehab PT Goals Patient Stated Goal: Regain IND PT Goal Formulation: With patient Time For Goal Achievement: 08/27/16 Potential to Achieve Goals: Good    Frequency 7X/week   Barriers to discharge        Co-evaluation               AM-PAC PT "6 Clicks" Daily Activity  Outcome Measure Difficulty turning over in bed (including adjusting bedclothes, sheets and blankets)?: A Little Difficulty moving from lying on back to sitting on the side of the bed? : A Little Difficulty sitting down on and standing up from a chair with arms (e.g., wheelchair, bedside commode, etc,.)?: A Little Help needed moving to and from a bed to chair (including a wheelchair)?: A Little Help needed walking in hospital room?: A Little Help needed climbing 3-5 steps with a railing? : A Little 6 Click Score: 18    End of Session   Activity Tolerance: Patient tolerated treatment well Patient left: in chair;with call bell/phone within reach;with family/visitor present Nurse Communication: Mobility status PT Visit Diagnosis: Difficulty in walking, not elsewhere classified (R26.2)    Time: [id number] PT Time Calculation (min) (ACUTE ONLY): 29 min   Charges:   PT Evaluation $PT Eval Low Complexity: 1 Procedure PT Treatments $Gait Training: 8-22 mins   PT G Codes:        Pg [phone]   Taliesin Hartlage 08/27/2016, 5:29 PM

## 2016-08-27 NOTE — Transfer of Care (Signed)
Immediate Anesthesia Transfer of Care Note  Patient: Marco Chapman  Procedure(s) Performed: Procedure(s): Decompression lumbar laminectomy L5-S1 right and microdisectomy L5-S1 right , forninotomy S1 root on the right (N/A)  Patient Location: PACU  Anesthesia Type:General  Level of Consciousness: awake, alert , oriented and patient cooperative  Airway & Oxygen Therapy: Patient Spontanous Breathing and Patient connected to face mask oxygen  Post-op Assessment: Report given to RN and Post -op Vital signs reviewed and stable  Post vital signs: Reviewed and stable  Last Vitals:  Vitals:   08/27/16 0529  BP: (!) 144/100  Pulse: 97  Resp: 18  Temp: 36.9 C    Last Pain:  Vitals:   08/27/16 0602  TempSrc:   PainSc: 7       Patients Stated Pain Goal: 4 (08/27/16 0602)  Complications: No apparent anesthesia complications

## 2016-08-28 DIAGNOSIS — M5127 Other intervertebral disc displacement, lumbosacral region: Secondary | ICD-10-CM | POA: Diagnosis not present

## 2016-08-28 DIAGNOSIS — M4807 Spinal stenosis, lumbosacral region: Secondary | ICD-10-CM | POA: Diagnosis not present

## 2016-08-28 DIAGNOSIS — R531 Weakness: Secondary | ICD-10-CM | POA: Diagnosis not present

## 2016-08-28 DIAGNOSIS — Z79899 Other long term (current) drug therapy: Secondary | ICD-10-CM | POA: Diagnosis not present

## 2016-08-28 LAB — CBC
HEMATOCRIT: 39.6 % (ref 39.0–52.0)
HEMOGLOBIN: 13.4 g/dL (ref 13.0–17.0)
MCH: 29.7 pg (ref 26.0–34.0)
MCHC: 33.8 g/dL (ref 30.0–36.0)
MCV: 87.8 fL (ref 78.0–100.0)
Platelets: 200 10*3/uL (ref 150–400)
RBC: 4.51 MIL/uL (ref 4.22–5.81)
RDW: 12.9 % (ref 11.5–15.5)
WBC: 12 10*3/uL — AB (ref 4.0–10.5)

## 2016-08-28 LAB — CK TOTAL AND CKMB (NOT AT ARMC)
CK, MB: 6.9 ng/mL — AB (ref 0.5–5.0)
RELATIVE INDEX: 0.8 (ref 0.0–2.5)
Total CK: 916 U/L — ABNORMAL HIGH (ref 49–397)

## 2016-08-28 LAB — TROPONIN I: Troponin I: <0.03 ng/mL (ref <0.03)

## 2016-08-28 MED ORDER — PANTOPRAZOLE SODIUM 40 MG PO TBEC
40.0000 mg | DELAYED_RELEASE_TABLET | Freq: Two times a day (BID) | ORAL | Status: DC
  Administered 2016-08-28: 09:00:00 40 mg via ORAL
  Filled 2016-08-28: qty 1

## 2016-08-28 MED ORDER — CALCIUM CARBONATE ANTACID 500 MG PO CHEW: 1.0000 | CHEWABLE_TABLET | Freq: Four times a day (QID) | ORAL | Status: DC | PRN

## 2016-08-28 MED ORDER — PANTOPRAZOLE SODIUM 40 MG IV SOLR: 20.0000 mg | Freq: Two times a day (BID) | INTRAVENOUS | Status: DC

## 2016-08-28 MED ORDER — GI COCKTAIL ~~LOC~~
30.0000 mL | Freq: Once | ORAL | Status: AC
  Administered 2016-08-28: 03:00:00 30 mL via ORAL
  Filled 2016-08-28: qty 30

## 2016-08-28 NOTE — Evaluation (Signed)
Occupational Therapy Evaluation Patient Details Name: Marco Chapman MRN: [id number] DOB: 1961/01/13 Today's Date: 08/28/2016    History of Present Illness Pt s/p L5-S1 lumbar decompression and microdisectomy.     Clinical Impression   Patient evaluated by Occupational Therapy with no further acute OT needs identified. All education has been completed and the patient has no further questions. See below for any follow-up Occupational Therapy or equipment needs. OT to sign off. Thank you for referral.      Follow Up Recommendations  No OT follow up    Equipment Recommendations  Tub/shower seat;Other (comment) (shower seat/ RW)    Recommendations for Other Services       Precautions / Restrictions Precautions Precautions: Back Precaution Booklet Issued: Yes (comment) Precaution Comments: reviewed in detail for adls Restrictions Weight Bearing Restrictions: No      Mobility Bed Mobility               General bed mobility comments: in chair on arrival. Discussed use of bed (hob elevated / feet) pt plans to elevate HOB at home but not higher than 30 degrees for sleeping  Transfers Overall transfer level: Needs assistance Equipment used: Rolling walker (2 wheeled) Transfers: Sit to/from Stand Sit to Stand: Min guard              Balance Overall balance assessment: Needs assistance Sitting-balance support: No upper extremity supported;Feet supported Sitting balance-Leahy Scale: Good     Standing balance support: No upper extremity supported Standing balance-Leahy Scale: Fair                             ADL either performed or assessed with clinical judgement   ADL Overall ADL's : Needs assistance/impaired Eating/Feeding: Independent   Grooming: Wash/dry face;Wash/dry hands;Oral care;Supervision/safety;Sitting Grooming Details (indicate cue type and reason): seated with wife (A) . wife is RN that teaches now Upper Body Bathing: Supervision/  safety;Sitting   Lower Body Bathing: Moderate assistance;Sit to/from stand   Upper Body Dressing : Supervision/safety;Sitting   Lower Body Dressing: Moderate assistance;Sit to/from stand Lower Body Dressing Details (indicate cue type and reason): pt requires (A) with R LE dressing at this time but able to cross L LE without (A) Toilet Transfer: Min guard;Ambulation Toilet Transfer Details (indicate cue type and reason): pt educated on fall risk due to decr speed with ambulation to bathroom compared to observed movement with RW early     Tub/ Shower Transfer: Min guard;Ambulation Tub/Shower Transfer Details (indicate cue type and reason): wife and pt requesting shower seat. pt noted to sway with vision occlusion.  Functional mobility during ADLs: Min guard General ADL Comments: pt educated on use of RW for pain management and stability with transfer. fall risk discussed and fall risk with light / dark due to vision occlusion.   Back handout provided and reviewed adls in detail. Pt educated on: set an alarm at night for medication, avoid sitting for long periods of time, correct bed positioning for sleeping, correct sequence for bed mobility, avoiding lifting more than 5 pounds and never wash directly over incision. All education is complete and patient indicates understanding.     Vision Baseline Vision/History: No visual deficits       Perception     Praxis      Pertinent Vitals/Pain Pain Assessment: Faces Pain Score: 4  Faces Pain Scale: Hurts little more Pain Location: bac Pain Descriptors / Indicators: Aching;Sore Pain Intervention(s): Monitored during  session;Premedicated before session;Repositioned     Hand Dominance Right   Extremity/Trunk Assessment Upper Extremity Assessment Upper Extremity Assessment: Overall WFL for tasks assessed   Lower Extremity Assessment Lower Extremity Assessment: Defer to PT evaluation   Cervical / Trunk Assessment Cervical / Trunk  Assessment: Normal   Communication Communication Communication: No difficulties   Cognition Arousal/Alertness: Awake/alert Behavior During Therapy: WFL for tasks assessed/performed Overall Cognitive Status: Within Functional Limits for tasks assessed                                     General Comments  educated on bathing: Pt educated on bathing and avoid washing directly on incision. Pt educated to use new wash cloth and towel each day. Pt educated to allow water to run across dressing and not to soak in a tub at this time. Pt advised RN will instruct on any bandages required otherwise is open to air.       Exercises     Shoulder Instructions      Home Living Family/patient expects to be discharged to:: Private residence Living Arrangements: Spouse/significant other Available Help at Discharge: Family Type of Home: House Home Access: Stairs to enter (from garage) Secretary/administrator of Steps: 2 Entrance Stairs-Rails: None Home Layout: Two level;Able to live on main level with bedroom/bathroom Alternate Level Stairs-Number of Steps: 14 Alternate Level Stairs-Rails: Right Bathroom Shower/Tub: Producer, television/film/video: Standard     Home Equipment: None          Prior Functioning/Environment Level of Independence: Independent                 OT Problem List:        OT Treatment/Interventions:      OT Goals(Current goals can be found in the care plan section) Acute Rehab OT Goals Patient Stated Goal: go home today  OT Frequency:     Barriers to D/C:            Co-evaluation              AM-PAC PT "6 Clicks" Daily Activity     Outcome Measure Help from another person eating meals?: None Help from another person taking care of personal grooming?: None Help from another person toileting, which includes using toliet, bedpan, or urinal?: None Help from another person bathing (including washing, rinsing, drying)?:  None Help from another person to put on and taking off regular upper body clothing?: None Help from another person to put on and taking off regular lower body clothing?: A Little 6 Click Score: 23   End of Session Equipment Utilized During Treatment: Rolling walker Nurse Communication: Mobility status;Precautions  Activity Tolerance: Patient tolerated treatment well Patient left: in chair;with call bell/phone within reach;with family/visitor present  OT Visit Diagnosis: Unsteadiness on feet (R26.81)                Time: 1610-9604 OT Time Calculation (min): 27 min Charges:  OT General Charges $OT Visit: 1 Procedure OT Evaluation $OT Eval Moderate Complexity: 1 Procedure OT Treatments $Self Care/Home Management : 8-22 mins G-Codes: OT G-codes **NOT FOR INPATIENT CLASS** Functional Assessment Tool Used: Clinical judgement Functional Limitation: Self care Self Care Current Status (V4098): At least 20 percent but less than 40 percent impaired, limited or restricted Self Care Discharge Status [phone]): 0 percent impaired, limited or restricted    Marco Chapman   OTR/L  Pager: [phone] Office: [phone] .   Boone Chapman, Marco Havener B 08/28/2016, 11:08 AM

## 2016-08-28 NOTE — Progress Notes (Signed)
   Subjective:  Patient reports pain as mild.  Patient states his leg pain has resolved since surgery. He is eager to go home. Denies N/V/CP/SOB.  Objective:   VITALS:   Vitals:   08/28/16 0133 08/28/16 0244 08/28/16 0321 08/28/16 0515  BP: (!) 157/90 (!) 167/98 138/86 138/64  Pulse: (!) 109 (!) 108 96 94  Resp: 16 16    Temp: 99.3 F (37.4 C) 99 F (37.2 C)  99.5 F (37.5 C)  TempSrc: Oral Oral  Oral  SpO2: 96% 98%  98%  Weight:      Height:        NAD ABD soft Sensation intact distally Intact pulses distally Dorsiflexion/Plantar flexion intact Incision: dressign changed - incis c/d/i   Lab Results  Component Value Date   WBC 12.0 (H) 08/28/2016   HGB 13.4 08/28/2016   HCT 39.6 08/28/2016   MCV 87.8 08/28/2016   PLT 200 08/28/2016   BMET    Component Value Date/Time   NA 140 08/20/2016 1109   K 4.1 08/20/2016 1109   CL 103 08/20/2016 1109   CO2 29 08/20/2016 1109   GLUCOSE 99 08/20/2016 1109   BUN 12 08/20/2016 1109   CREATININE 1.08 08/20/2016 1109   CALCIUM 9.4 08/20/2016 1109   GFRNONAA >60 08/20/2016 1109   GFRAA >60 08/20/2016 1109     Assessment/Plan: 1 Day Post-Op   Active Problems:   Spinal stenosis, lumbar region with neurogenic claudication   Mobilize OOB with therapy PO pain control D/C home   Damian Buckles, Cloyde ReamsBrian James 08/28/2016, 8:32 AM   Samson FredericBrian Emilina Smarr, MD Cell [phone]

## 2016-08-28 NOTE — Progress Notes (Signed)
Physical Therapy Treatment Patient Details Name: Marco Chapman MRN: [id number] DOB: 10/16/1960 Today's Date: 08/28/2016    History of Present Illness Pt s/p L5-S1 lumbar decompression and microdisectomy.      PT Comments    Assisted with amb an increased distance with RW.   Pt given a back handout and booklet.  Instructed and reviewed all back precautions, lifting restrictions and expected activity level. Pt instructed on sleeping positions, use of pillows and use of ICE packs to incision area.  Progressing well.  All mobility questions addressed.  Pt plans to D/C to home today.  Follow Up Recommendations  No PT follow up     Equipment Recommendations       Recommendations for Other Services       Precautions / Restrictions Precautions Precautions: Back Precaution Booklet Issued: Yes (comment) Precaution Comments: given handout, back booklet and reviewed throughout session Restrictions Weight Bearing Restrictions: No    Mobility  Bed Mobility               General bed mobility comments: OOB sitting EOB eating breakfast  Transfers Overall transfer level: Needs assistance Equipment used: Rolling walker (2 wheeled) Transfers: Sit to/from Stand Sit to Stand: Min guard;Supervision         General transfer comment: cues for transition position, adherence to back precautions and use of UEs to self assist  Ambulation/Gait Ambulation/Gait assistance: Supervision;Min guard Ambulation Distance (Feet): 180 Feet Assistive device: Rolling walker (2 wheeled) Gait Pattern/deviations: Step-through pattern;Decreased step length - right;Decreased step length - left;Shuffle Gait velocity: decreased   General Gait Details: cues for posture and position from RW   excessive WBing/use of walker noted   Stairs            Wheelchair Mobility    Modified Rankin (Stroke Patients Only)       Balance                                             Cognition Arousal/Alertness: Awake/alert Behavior During Therapy: WFL for tasks assessed/performed Overall Cognitive Status: Within Functional Limits for tasks assessed                                        Exercises      General Comments        Pertinent Vitals/Pain Pain Assessment: 0-10 Pain Score: 2  Pain Location: back Pain Descriptors / Indicators: Operative site guarding Pain Intervention(s): Monitored during session;Repositioned;Ice applied    Home Living                      Prior Function            PT Goals (current goals can now be found in the care plan section) Progress towards PT goals: Progressing toward goals    Frequency    7X/week      PT Plan Current plan remains appropriate    Co-evaluation              AM-PAC PT "6 Clicks" Daily Activity  Outcome Measure  Difficulty turning over in bed (including adjusting bedclothes, sheets and blankets)?: A Little Difficulty moving from lying on back to sitting on the side of the bed? : A Little Difficulty sitting down on  and standing up from a chair with arms (e.g., wheelchair, bedside commode, etc,.)?: A Little Help needed moving to and from a bed to chair (including a wheelchair)?: A Little Help needed walking in hospital room?: A Little Help needed climbing 3-5 steps with a railing? : A Little 6 Click Score: 18    End of Session Equipment Utilized During Treatment: Gait belt Activity Tolerance: Patient tolerated treatment well Patient left: in chair;with call bell/phone within reach Nurse Communication: Mobility status (pt ready for D/c) PT Visit Diagnosis: Difficulty in walking, not elsewhere classified (R26.2)     Time: 1610-9604 PT Time Calculation (min) (ACUTE ONLY): 25 min  Charges:  $Gait Training: 8-22 mins $Therapeutic Activity: 8-22 mins                    G Codes:       {Malik Ruffino  PTA [facility]  Acute  Rehab Pager      [phone]

## 2016-08-28 NOTE — Progress Notes (Signed)
Nurse paged on-call provider to have him review EKG. Nurse is awaiting return phone call.

## 2016-08-28 NOTE — Care Management Note (Signed)
Case Management Note  Patient Details  Name: [name] L Berkland MRN: [id number] Date of Birth: 02/03/1961  Subjective/Objective:  Decompression Lumbar Laminectomy                  Action/Plan: Discharge Planning: NCM spoke to pt and he does not have a PCP. Provided pt with list of physician that accept [employer]. Explained he can call the toll free number on his card to request a provider list or go to the website.    Expected Discharge Date:  08/28/16               Expected Discharge Plan:  Home/Self Care  In-House Referral:  NA  Discharge planning Services  CM Consult  Post Acute Care Choice:  NA Choice offered to:  NA  DME Arranged:  N/A DME Agency:  NA  HH Arranged:  NA HH Agency:  NA  Status of Service:  Completed, signed off  If discussed at Long Length of Stay Meetings, dates discussed:    Additional Comments:  Elliot CousinShavis, Nester Bachus Ellen, RN 08/28/2016, 9:50 AM

## 2016-08-28 NOTE — Discharge Summary (Signed)
Physician Discharge Summary  Patient ID: Marco Chapman MRN: [id number] DOB/AGE: 54/09/1960 54 y.o.  Admit date: 08/27/2016 Discharge date: 08/28/2016  Admission Diagnoses:  Spinal stenosis, lumbar region with neurogenic claudication  Discharge Diagnoses:  Principal Problem:   Spinal stenosis, lumbar region with neurogenic claudication   History reviewed. No pertinent past medical history.  Surgeries: Procedure(s): Decompression lumbar laminectomy L5-S1 right and microdisectomy L5-S1 right , forninotomy S1 root on the right on 08/27/2016   Consultants (if any):   Discharged Condition: Improved  Hospital Course: Marco Chapman is an 56 y.o. male who was admitted 08/27/2016 with a diagnosis of Spinal stenosis, lumbar region with neurogenic claudication and went to the operating room on 08/27/2016 and underwent the above named procedures.    He was given perioperative antibiotics:  Anti-infectives    Start     Dose/Rate Route Frequency Ordered Stop   08/27/16 1600  ceFAZolin (ANCEF) IVPB 1 g/50 mL premix     1 g 100 mL/hr over 30 Minutes Intravenous Every 8 hours 08/27/16 1047 08/28/16 0615   08/27/16 0806  polymyxin B 500,000 Units, bacitracin 50,000 Units in sodium chloride irrigation 0.9 % 500 mL irrigation  Status:  Discontinued       As needed 08/27/16 0806 08/27/16 0859   08/27/16 0627  ceFAZolin (ANCEF) 2-4 GM/100ML-% IVPB    Comments:  Delphia Grateshandler, Loraine   : cabinet override      08/27/16 0627 08/27/16 1829   08/27/16 0551  ceFAZolin (ANCEF) IVPB 2g/100 mL premix     2 g 200 mL/hr over 30 Minutes Intravenous On call to O.R. 08/27/16 [phone] 08/27/16 0755    .  He was given sequential compression devices and early ambulation for DVT prophylaxis.  He benefited maximally from the hospital stay and there were no complications.    Recent vital signs:  Vitals:   08/28/16 0321 08/28/16 0515  BP: 138/86 138/64  Pulse: 96 94  Resp:    Temp:  99.5 F (37.5 C)    Recent  laboratory studies:  Lab Results  Component Value Date   HGB 13.4 08/28/2016   HGB 14.8 08/20/2016   Lab Results  Component Value Date   WBC 12.0 (H) 08/28/2016   PLT 200 08/28/2016   Lab Results  Component Value Date   INR 0.94 08/20/2016   Lab Results  Component Value Date   NA 140 08/20/2016   K 4.1 08/20/2016   CL 103 08/20/2016   CO2 29 08/20/2016   BUN 12 08/20/2016   CREATININE 1.08 08/20/2016   GLUCOSE 99 08/20/2016    Discharge Medications:   Allergies as of 08/28/2016   No Known Allergies     Medication List    TAKE these medications   amLODipine 10 MG tablet Commonly known as:  NORVASC Take 10 mg by mouth every evening.   calcium carbonate 500 MG chewable tablet Commonly known as:  TUMS - dosed in mg elemental calcium Chew 2 tablets by mouth daily as needed for indigestion or heartburn.   DUEXIS 800-26.6 MG Tabs Generic drug:  Ibuprofen-Famotidine Take 1 tablet by mouth 3 (three) times daily as needed. Pain   Echinacea 400 MG Caps Take 400 mg by mouth 3 (three) times daily.   lisinopril 5 MG tablet Commonly known as:  PRINIVIL,ZESTRIL Take 5 mg by mouth every evening.   methocarbamol 500 MG tablet Commonly known as:  ROBAXIN Take 1 tablet (500 mg total) by mouth every 8 (eight) hours as needed  for muscle spasms.   multivitamin with minerals Tabs tablet Take 1 tablet by mouth daily.   oxyCODONE-acetaminophen 5-325 MG tablet Commonly known as:  PERCOCET/ROXICET Take 1-2 tablets by mouth every 4 (four) hours as needed for moderate pain.       Diagnostic Studies: Dg Chest 2 View  Result Date: 08/20/2016 CLINICAL DATA:  Preoperative evaluation for upcoming lumbar surgery. EXAM: CHEST  2 VIEW COMPARISON:  None. FINDINGS: Cardiac shadow is within normal limits. The lungs are mildly hyperinflated consistent with COPD. No focal infiltrate or sizable effusion is noted. No bony abnormality is seen. IMPRESSION: No active cardiopulmonary disease.  Electronically Signed   By: Alcide Clever M.D.   On: 08/20/2016 14:48   Dg Lumbar Spine 2-3 Views  Result Date: 08/20/2016 CLINICAL DATA:  Preoperative evaluation for upcoming lumbar surgery EXAM: LUMBAR SPINE - 2-3 VIEW COMPARISON:  None. FINDINGS: Five lumbar type vertebral bodies are well visualized. Mild osteophytic changes are noted. Disc space narrowing is noted at L4-5 and L5-S1. Numbering nomenclature given the 12 paired ribs on recent chest x-ray IMPRESSION: Degenerative change without acute abnormality. Electronically Signed   By: Alcide Clever M.D.   On: 08/20/2016 14:53   Dg Spine Portable 1 View  Result Date: 08/27/2016 CLINICAL DATA:  Back pain. EXAM: PORTABLE SPINE - 1 VIEW COMPARISON:  Earlier today FINDINGS: Image labeled 3 was submitted. The lumbar spine levels have been annotated using the same numbering scheme as the previous exam. The surgical probe is posterior to the L5 vertebra. IMPRESSION: Surgical probe localizes to the L5 vertebra. Electronically Signed   By: Signa Kell M.D.   On: 08/27/2016 08:26   Dg Spine Portable 1 View  Result Date: 08/27/2016 CLINICAL DATA:  Laminectomy EXAM: PORTABLE SPINE - 1 VIEW COMPARISON:  Study obtained earlier in the day FINDINGS: Cross-table lateral lumbar image labeled #2 submitted. Metallic probe tip is posterior to the midportion of the L5 vertebral body, overlying the L5 spinous process. No fracture or spondylolisthesis evident. IMPRESSION: Metallic probe tip overlies the L5 spinous process with the tip directed posterior to the midportion of the L5 vertebral body. No fracture or spondylolisthesis. Electronically Signed   By: Bretta Bang III M.D.   On: 08/27/2016 08:05   Dg Spine Portable 1 View  Result Date: 08/27/2016 CLINICAL DATA:  Surgical level L5-S1.  Please # EXAM: PORTABLE SPINE - 1 VIEW COMPARISON:  08/20/2016 FINDINGS: Single lateral portable intraoperative radiograph shows 2 needles from posterior approach, 1 projecting  over the L5 spinous process, 1 projecting posterior to the L5-S1 interspace. IMPRESSION: Intraoperative localization Electronically Signed   By: Corlis Leak M.D.   On: 08/27/2016 07:55    Disposition: Final discharge disposition not confirmed  Discharge Instructions    Call MD / Call 911    Complete by:  As directed    If you experience chest pain or shortness of breath, CALL 911 and be transported to the hospital emergency room.  If you develope a fever above 101 F, pus (white drainage) or increased drainage or redness at the wound, or calf pain, call your surgeon's office.   Call MD / Call 911    Complete by:  As directed    If you experience chest pain or shortness of breath, CALL 911 and be transported to the hospital emergency room.  If you develope a fever above 101 F, pus (white drainage) or increased drainage or redness at the wound, or calf pain, call your surgeon's office.  Constipation Prevention    Complete by:  As directed    Drink plenty of fluids.  Prune juice may be helpful.  You may use a stool softener, such as Colace (over the counter) 100 mg twice a day.  Use MiraLax (over the counter) for constipation as needed.   Constipation Prevention    Complete by:  As directed    Drink plenty of fluids.  Prune juice may be helpful.  You may use a stool softener, such as Colace (over the counter) 100 mg twice a day.  Use MiraLax (over the counter) for constipation as needed.   Diet - low sodium heart healthy    Complete by:  As directed    Diet - low sodium heart healthy    Complete by:  As directed    Discharge instructions    Complete by:  As directed    For the first fhree days, remove your dressing, tape a piece of saran wrap over your incision,. Take your shower, then remove the saran wrap and put a clean dressing on. After three days you can shower without the saran wrap.  No lifting or bending. No driving while taking pain medications. Call Dr. Darrelyn Hillock if any wound  complications or temperature of 101 degrees F or over.  Call the office for an appointment to see Dr. Darrelyn Hillock in two weeks: [phone] and ask for Dr. Jeannetta Ellis nurse, Mackey Birchwood.   Discharge instructions    Complete by:  As directed    See Dr. Jeannetta Ellis instructions   Driving restrictions    Complete by:  As directed    No driving for 6 weeks   Increase activity slowly as tolerated    Complete by:  As directed    Increase activity slowly as tolerated    Complete by:  As directed    Lifting restrictions    Complete by:  As directed    No lifting for 6 weeks      Follow-up Information    Ranee Gosselin, MD. Schedule an appointment as soon as possible for a visit in 2 week(s).   Specialty:  Orthopedic Surgery Contact information: [address] [place] [address] [phone]            Signed: Garnet Koyanagi 08/28/2016, 8:36 AM

## 2016-08-28 NOTE — Progress Notes (Addendum)
N[facility] to make provider aware of patients blood pressure and pulse continuing to be elevated. Current vital signs as follows: BP 153/88, T 98.5, P 112, R 18, O2 97% on room air. Patient reports pain at level 4, refusing pain medication at this time. Nurse is waiting for return call from on-call provider.   Avel Peacerew Perkins returned call to nurse. Per Kenard Gowerrew, get EKG. Kenard Gowerrew and Nurse suspect patient is having pain. Nurse will talk with patient concerning pain in more detail. EKG will be completed.

## 2016-08-30 NOTE — Discharge Summary (Signed)
Physician Discharge Summary   Patient ID: Marco Chapman MRN: [id number] DOB/AGE: Apr 05, 1960 56 y.o.  Admit date: 08/27/2016 Discharge date: 08/28/2016  Primary Diagnosis: Lumbar spinal stenosis and disc herniation   Admission Diagnoses:  History reviewed. No pertinent past medical history. Discharge Diagnoses:   Principal Problem:   Spinal stenosis, lumbar region with neurogenic claudication  Estimated body mass index is 28.73 kg/m as calculated from the following:   Height as of this encounter: _0  (1.803 m).   Weight as of this encounter: 93.4 kg (206 lb).  Procedure:  Procedure(s) (LRB): Decompression lumbar laminectomy L5-S1 right and microdisectomy L5-S1 right , forninotomy S1 root on the right (N/A)   Consults: None  HPI: The patient presented with the chief complaint of low back pain with progressively worsening right leg pain and weakness. MRI showed a disc herniation and stenosis at L5-S1.   Laboratory Data: Admission on 08/27/2016, Discharged on 08/28/2016  Component Date Value Ref Range Status  . WBC 08/28/2016 12.0* 4.0 - 10.5 K/uL Final  . RBC 08/28/2016 4.51  4.22 - 5.81 MIL/uL Final  . Hemoglobin 08/28/2016 13.4  13.0 - 17.0 g/dL Final  . HCT 08/28/2016 39.6  39.0 - 52.0 % Final  . MCV 08/28/2016 87.8  78.0 - 100.0 fL Final  . MCH 08/28/2016 29.7  26.0 - 34.0 pg Final  . MCHC 08/28/2016 33.8  30.0 - 36.0 g/dL Final  . RDW 08/28/2016 12.9  11.5 - 15.5 % Final  . Platelets 08/28/2016 200  150 - 400 K/uL Final  . Total CK 08/28/2016 916* 49 - 397 U/L Final  . CK, MB 08/28/2016 6.9* 0.5 - 5.0 ng/mL Final  . Relative Index 08/28/2016 0.8  0.0 - 2.5 Final   Performed at [facility], [place] [address], [place], [place] [address]  . Troponin I 08/28/2016 <0.03  <0.03 ng/mL Final  Hospital Outpatient Visit on 08/20/2016  Component Date Value Ref Range Status  . aPTT 08/20/2016 27  24 - 36 seconds Final  . WBC 08/20/2016 4.3  4.0 - 10.5 K/uL Final  . RBC  08/20/2016 4.96  4.22 - 5.81 MIL/uL Final  . Hemoglobin 08/20/2016 14.8  13.0 - 17.0 g/dL Final  . HCT 08/20/2016 44.1  39.0 - 52.0 % Final  . MCV 08/20/2016 88.9  78.0 - 100.0 fL Final  . MCH 08/20/2016 29.8  26.0 - 34.0 pg Final  . MCHC 08/20/2016 33.6  30.0 - 36.0 g/dL Final  . RDW 08/20/2016 13.0  11.5 - 15.5 % Final  . Platelets 08/20/2016 184  150 - 400 K/uL Final  . Neutrophils Relative % 08/20/2016 38  % Final  . Neutro Abs 08/20/2016 1.6* 1.7 - 7.7 K/uL Final  . Lymphocytes Relative 08/20/2016 43  % Final  . Lymphs Abs 08/20/2016 1.9  0.7 - 4.0 K/uL Final  . Monocytes Relative 08/20/2016 14  % Final  . Monocytes Absolute 08/20/2016 0.6  0.1 - 1.0 K/uL Final  . Eosinophils Relative 08/20/2016 4  % Final  . Eosinophils Absolute 08/20/2016 0.2  0.0 - 0.7 K/uL Final  . Basophils Relative 08/20/2016 1  % Final  . Basophils Absolute 08/20/2016 0.0  0.0 - 0.1 K/uL Final  . Sodium 08/20/2016 140  135 - 145 mmol/L Final  . Potassium 08/20/2016 4.1  3.5 - 5.1 mmol/L Final  . Chloride 08/20/2016 103  101 - 111 mmol/L Final  . CO2 08/20/2016 29  22 - 32 mmol/L Final  . Glucose, Bld  08/20/2016 99  65 - 99 mg/dL Final  . BUN 08/20/2016 12  6 - 20 mg/dL Final  . Creatinine, Ser 08/20/2016 1.08  0.61 - 1.24 mg/dL Final  . Calcium 08/20/2016 9.4  8.9 - 10.3 mg/dL Final  . Total Protein 08/20/2016 7.6  6.5 - 8.1 g/dL Final  . Albumin 08/20/2016 4.1  3.5 - 5.0 g/dL Final  . AST 08/20/2016 28  15 - 41 U/L Final  . ALT 08/20/2016 31  17 - 63 U/L Final  . Alkaline Phosphatase 08/20/2016 59  38 - 126 U/L Final  . Total Bilirubin 08/20/2016 0.5  0.3 - 1.2 mg/dL Final  . GFR calc non Af Amer 08/20/2016 >60  >60 mL/min Final  . GFR calc Af Amer 08/20/2016 >60  >60 mL/min Final   Comment: (NOTE) The eGFR has been calculated using the CKD EPI equation. This calculation has not been validated in all clinical situations. eGFR's persistently <60 mL/min signify possible Chronic Kidney Disease.   .  Anion gap 08/20/2016 8  5 - 15 Final  . Prothrombin Time 08/20/2016 12.5  11.4 - 15.2 seconds Final  . INR 08/20/2016 0.94   Final  . Color, Urine 08/20/2016 YELLOW  YELLOW Final  . APPearance 08/20/2016 HAZY* CLEAR Final  . Specific Gravity, Urine 08/20/2016 1.012  1.005 - 1.030 Final  . pH 08/20/2016 5.0  5.0 - 8.0 Final  . Glucose, UA 08/20/2016 NEGATIVE  NEGATIVE mg/dL Final  . Hgb urine dipstick 08/20/2016 NEGATIVE  NEGATIVE Final  . Bilirubin Urine 08/20/2016 NEGATIVE  NEGATIVE Final  . Ketones, ur 08/20/2016 NEGATIVE  NEGATIVE mg/dL Final  . Protein, ur 08/20/2016 NEGATIVE  NEGATIVE mg/dL Final  . Nitrite 08/20/2016 NEGATIVE  NEGATIVE Final  . Leukocytes, UA 08/20/2016 NEGATIVE  NEGATIVE Final  . MRSA, PCR 08/20/2016 NEGATIVE  NEGATIVE Final  . Staphylococcus aureus 08/20/2016 NEGATIVE  NEGATIVE Final   Comment:        The Xpert SA Assay (FDA approved for NASAL specimens in patients over 40 years of age), is one component of a comprehensive surveillance program.  Test performance has been validated by [facility] for patients greater than or equal to 37 year old. It is not intended to diagnose infection nor to guide or monitor treatment.      X-Rays:Dg Chest 2 View  Result Date: 08/20/2016 CLINICAL DATA:  Preoperative evaluation for upcoming lumbar surgery. EXAM: CHEST  2 VIEW COMPARISON:  None. FINDINGS: Cardiac shadow is within normal limits. The lungs are mildly hyperinflated consistent with COPD. No focal infiltrate or sizable effusion is noted. No bony abnormality is seen. IMPRESSION: No active cardiopulmonary disease. Electronically Signed   By: Inez Catalina M.D.   On: 08/20/2016 14:48   Dg Lumbar Spine 2-3 Views  Result Date: 08/20/2016 CLINICAL DATA:  Preoperative evaluation for upcoming lumbar surgery EXAM: LUMBAR SPINE - 2-3 VIEW COMPARISON:  None. FINDINGS: Five lumbar type vertebral bodies are well visualized. Mild osteophytic changes are noted. Disc space  narrowing is noted at L4-5 and L5-S1. Numbering nomenclature given the 12 paired ribs on recent chest x-ray IMPRESSION: Degenerative change without acute abnormality. Electronically Signed   By: Inez Catalina M.D.   On: 08/20/2016 14:53   Dg Spine Portable 1 View  Result Date: 08/27/2016 CLINICAL DATA:  Back pain. EXAM: PORTABLE SPINE - 1 VIEW COMPARISON:  Earlier today FINDINGS: Image labeled 3 was submitted. The lumbar spine levels have been annotated using the same numbering scheme as the previous exam. The  surgical probe is posterior to the L5 vertebra. IMPRESSION: Surgical probe localizes to the L5 vertebra. Electronically Signed   By: Kerby Moors M.D.   On: 08/27/2016 08:26   Dg Spine Portable 1 View  Result Date: 08/27/2016 CLINICAL DATA:  Laminectomy EXAM: PORTABLE SPINE - 1 VIEW COMPARISON:  Study obtained earlier in the day FINDINGS: Cross-table lateral lumbar image labeled #2 submitted. Metallic probe tip is posterior to the midportion of the L5 vertebral body, overlying the L5 spinous process. No fracture or spondylolisthesis evident. IMPRESSION: Metallic probe tip overlies the L5 spinous process with the tip directed posterior to the midportion of the L5 vertebral body. No fracture or spondylolisthesis. Electronically Signed   By: Lowella Grip III M.D.   On: 08/27/2016 08:05   Dg Spine Portable 1 View  Result Date: 08/27/2016 CLINICAL DATA:  Surgical level L5-S1.  Please # EXAM: PORTABLE SPINE - 1 VIEW COMPARISON:  08/20/2016 FINDINGS: Single lateral portable intraoperative radiograph shows 2 needles from posterior approach, 1 projecting over the L5 spinous process, 1 projecting posterior to the L5-S1 interspace. IMPRESSION: Intraoperative localization Electronically Signed   By: Lucrezia Europe M.D.   On: 08/27/2016 07:55    EKG: Orders placed or performed during the hospital encounter of 08/27/16  . EKG 12-Lead  . EKG 12-Lead     Hospital Course: KIEN MIRSKY is a 56 y.o. who  was admitted to [facility]. They were brought to the operating room on 08/27/2016 and underwent Procedure(s): Decompression lumbar laminectomy L5-S1 right and microdisectomy L5-S1 right , forninotomy S1 root on the right.  Patient tolerated the procedure well and was later transferred to the recovery room and then to the orthopaedic floor for postoperative care.  They were given PO and IV analgesics for pain control following their surgery.  They were given 24 hours of postoperative antibiotics of  Anti-infectives    Start     Dose/Rate Route Frequency Ordered Stop   08/27/16 1600  ceFAZolin (ANCEF) IVPB 1 g/50 mL premix     1 g 100 mL/hr over 30 Minutes Intravenous Every 8 hours 08/27/16 1047 08/28/16 0615   08/27/16 0806  polymyxin B 500,000 Units, bacitracin 50,000 Units in sodium chloride irrigation 0.9 % 500 mL irrigation  Status:  Discontinued       As needed 08/27/16 0806 08/27/16 0859   08/27/16 0627  ceFAZolin (ANCEF) 2-4 GM/100ML-% IVPB    Comments:  Dione Booze   : cabinet override      08/27/16 0627 08/27/16 1829   08/27/16 0551  ceFAZolin (ANCEF) IVPB 2g/100 mL premix     2 g 200 mL/hr over 30 Minutes Intravenous On call to O.R. 08/27/16 5638 08/27/16 0755     and started on DVT prophylaxis in the form of Aspirin.   PT was ordered to ambulate the patient.  Discharge planning consulted to help with postop disposition and equipment needs.  Patient had a good night on the evening of surgery.  They started to get up OOB with therapy on day one. The patient had progressed with therapy and meeting their goals.  Incision was healing well.  Patient was seen in rounds and was ready to go home.   Diet: Cardiac diet Activity:WBAT Follow-up:in 2 weeks Disposition - Home Discharged Condition: stable   Discharge Instructions    Call MD / Call 911    Complete by:  As directed    If you experience chest pain or shortness of breath, CALL 911  and be transported to the hospital  emergency room.  If you develope a fever above 101 F, pus (white drainage) or increased drainage or redness at the wound, or calf pain, call your surgeon's office.   Call MD / Call 911    Complete by:  As directed    If you experience chest pain or shortness of breath, CALL 911 and be transported to the hospital emergency room.  If you develope a fever above 101 F, pus (white drainage) or increased drainage or redness at the wound, or calf pain, call your surgeon's office.   Constipation Prevention    Complete by:  As directed    Drink plenty of fluids.  Prune juice may be helpful.  You may use a stool softener, such as Colace (over the counter) 100 mg twice a day.  Use MiraLax (over the counter) for constipation as needed.   Constipation Prevention    Complete by:  As directed    Drink plenty of fluids.  Prune juice may be helpful.  You may use a stool softener, such as Colace (over the counter) 100 mg twice a day.  Use MiraLax (over the counter) for constipation as needed.   Diet - low sodium heart healthy    Complete by:  As directed    Diet - low sodium heart healthy    Complete by:  As directed    Discharge instructions    Complete by:  As directed    For the first fhree days, remove your dressing, tape a piece of saran wrap over your incision,. Take your shower, then remove the saran wrap and put a clean dressing on. After three days you can shower without the saran wrap.  No lifting or bending. No driving while taking pain medications. Call Dr. Gladstone Lighter if any wound complications or temperature of 101 degrees F or over.  Call the office for an appointment to see Dr. Gladstone Lighter in two weeks: [phone] and ask for Dr. Charlestine Night nurse, Brunilda Payor.   Discharge instructions    Complete by:  As directed    See Dr. Charlestine Night instructions   Driving restrictions    Complete by:  As directed    No driving for 6 weeks   Increase activity slowly as tolerated    Complete by:  As directed      Increase activity slowly as tolerated    Complete by:  As directed    Lifting restrictions    Complete by:  As directed    No lifting for 6 weeks     Allergies as of 08/28/2016   No Known Allergies     Medication List    TAKE these medications   amLODipine 10 MG tablet Commonly known as:  NORVASC Take 10 mg by mouth every evening.   calcium carbonate 500 MG chewable tablet Commonly known as:  TUMS - dosed in mg elemental calcium Chew 2 tablets by mouth daily as needed for indigestion or heartburn.   DUEXIS 800-26.6 MG Tabs Generic drug:  Ibuprofen-Famotidine Take 1 tablet by mouth 3 (three) times daily as needed. Pain   Echinacea 400 MG Caps Take 400 mg by mouth 3 (three) times daily.   lisinopril 5 MG tablet Commonly known as:  PRINIVIL,ZESTRIL Take 5 mg by mouth every evening.   methocarbamol 500 MG tablet Commonly known as:  ROBAXIN Take 1 tablet (500 mg total) by mouth every 8 (eight) hours as needed for muscle spasms.   multivitamin with minerals  Tabs tablet Take 1 tablet by mouth daily.   oxyCODONE-acetaminophen 5-325 MG tablet Commonly known as:  PERCOCET/ROXICET Take 1-2 tablets by mouth every 4 (four) hours as needed for moderate pain.      Follow-up Information    Latanya Maudlin, MD. Schedule an appointment as soon as possible for a visit in 2 week(s).   Specialty:  Orthopedic Surgery Contact information: [address] [place] [address] [phone]           Signed: Ardeen Jourdain, PA-C Orthopaedic Surgery 08/30/2016, 8:50 AM

## 2016-09-09 NOTE — Progress Notes (Signed)
   08/27/16 1700  PT Time Calculation  PT Start Time (ACUTE ONLY) 1610  PT Stop Time (ACUTE ONLY) 1639  PT Time Calculation (min) (ACUTE ONLY) 29 min  PT G-Codes **NOT FOR INPATIENT CLASS**  Functional Assessment Tool Used Clinical judgement  Functional Limitation Mobility: Walking and moving around  Mobility: Walking and Moving Around Current Status (Z6109(G8978) CJ  Mobility: Walking and Moving Around Goal Status (U0454(G8979) CI  PT General Charges  $$ ACUTE PT VISIT 1 Procedure  PT Evaluation  $PT Eval Low Complexity 1 Procedure  PT Treatments  $Gait Training 8-22 mins

## 2017-01-19 NOTE — Patient Instructions (Addendum)
Marco Chapman  01/19/2017   Your procedure is scheduled on: 01-28-17   Report to [facility] Main  Entrance Take [place]  elevators to 3rd floor to  Short Stay Center at 1045   Call this number if you have problems the morning of surgery [phone]    Remember: ONLY 1 PERSON MAY GO WITH YOU TO SHORT STAY TO GET  READY MORNING OF YOUR SURGERY.  Do not eat food or drink liquids :After Midnight.  You may have a Clear Liquid Diet from Midnight until 7:15. After 7:15 AM, nothing until after surgery.     CLEAR LIQUID DIET   Foods Allowed                                                                     Foods Excluded  Coffee and tea, regular and decaf                             liquids that you cannot  Plain Jell-O in any flavor                                             see through such as: Fruit ices (not with fruit pulp)                                     milk, soups, orange juice  Iced Popsicles                                    All solid food Carbonated beverages, regular and diet                                    Cranberry, grape and apple juices Sports drinks like Gatorade Lightly seasoned clear broth or consume(fat free) Sugar, honey syrup  Sample Menu Breakfast                                Lunch                                     Supper Cranberry juice                    Beef broth                            Chicken broth Jell-O                                     Grape juice  Apple juice Coffee or tea                        Jell-O                                      Popsicle                                                Coffee or tea                        Coffee or tea  _____________________________________________________________________     Take these medicines the morning of surgery with A SIP OF WATER: None                                You may not have any metal on your body including hair pins and               piercings  Do not wear jewelry, lotions, powders or perfumes, deodorant             Men may shave face and neck.   Do not bring valuables to the hospital. [facility] IS NOT             RESPONSIBLE   FOR VALUABLES.  Contacts, dentures or bridgework may not be worn into surgery.  Leave suitcase in the car. After surgery it may be brought to your room.                  Please read over the following fact sheets you were given: _____________________________________________________________________                  _____________________________________________________________________             [facility] - Preparing for Surgery Before surgery, you can play an important role.  Because skin is not sterile, your skin needs to be as free of germs as possible.  You can reduce the number of germs on your skin by washing with CHG (chlorahexidine gluconate) soap before surgery.  CHG is an antiseptic cleaner which kills germs and bonds with the skin to continue killing germs even after washing. Please DO NOT use if you have an allergy to CHG or antibacterial soaps.  If your skin becomes reddened/irritated stop using the CHG and inform your nurse when you arrive at Short Stay. Do not shave (including legs and underarms) for at least 48 hours prior to the first CHG shower.  You may shave your face/neck. Please follow these instructions carefully:  1.  Shower with CHG Soap the night before surgery and the  morning of Surgery.  2.  If you choose to wash your hair, wash your hair first as usual with your  normal  shampoo.  3.  After you shampoo, rinse your hair and body thoroughly to remove the  shampoo.                           4.  Use CHG as you would any other liquid soap.  You can apply chg directly  to the  skin and wash                       Gently with a scrungie or clean washcloth.  5.  Apply the CHG Soap to your body ONLY FROM THE NECK DOWN.   Do not use on face/ open                            Wound or open sores. Avoid contact with eyes, ears mouth and genitals (private parts).                       Wash face,  Genitals (private parts) with your normal soap.             6.  Wash thoroughly, paying special attention to the area where your surgery  will be performed.  7.  Thoroughly rinse your body with warm water from the neck down.  8.  DO NOT shower/wash with your normal soap after using and rinsing off  the CHG Soap.                9.  Pat yourself dry with a clean towel.            10.  Wear clean pajamas.            11.  Place clean sheets on your bed the night of your first shower and do not  sleep with pets. Day of Surgery : Do not apply any lotions/deodorants the morning of surgery.  Please wear clean clothes to the hospital/surgery center.  FAILURE TO FOLLOW THESE INSTRUCTIONS MAY RESULT IN THE CANCELLATION OF YOUR SURGERY PATIENT SIGNATURE_________________________________  NURSE SIGNATURE__________________________________  ________________________________________________________________________   Rogelia Mire  An incentive spirometer is a tool that can help keep your lungs clear and active. This tool measures how well you are filling your lungs with each breath. Taking long deep breaths may help reverse or decrease the chance of developing breathing (pulmonary) problems (especially infection) following:  A long period of time when you are unable to move or be active. BEFORE THE PROCEDURE   If the spirometer includes an indicator to show your best effort, your nurse or respiratory therapist will set it to a desired goal.  If possible, sit up straight or lean slightly forward. Try not to slouch.  Hold the incentive spirometer in an upright position. INSTRUCTIONS FOR USE  1. Sit on the edge of your bed if possible, or sit up as far as you can in bed or on a chair. 2. Hold the incentive spirometer in an upright position. 3. Breathe out  normally. 4. Place the mouthpiece in your mouth and seal your lips tightly around it. 5. Breathe in slowly and as deeply as possible, raising the piston or the ball toward the top of the column. 6. Hold your breath for 3-5 seconds or for as long as possible. Allow the piston or ball to fall to the bottom of the column. 7. Remove the mouthpiece from your mouth and breathe out normally. 8. Rest for a few seconds and repeat Steps 1 through 7 at least 10 times every 1-2 hours when you are awake. Take your time and take a few normal breaths between deep breaths. 9. The spirometer may include an indicator to show your best effort. Use the indicator as a goal to work toward during each repetition. 10. After each set  of 10 deep breaths, practice coughing to be sure your lungs are clear. If you have an incision (the cut made at the time of surgery), support your incision when coughing by placing a pillow or rolled up towels firmly against it. Once you are able to get out of bed, walk around indoors and cough well. You may stop using the incentive spirometer when instructed by your caregiver.  RISKS AND COMPLICATIONS  Take your time so you do not get dizzy or light-headed.  If you are in pain, you may need to take or ask for pain medication before doing incentive spirometry. It is harder to take a deep breath if you are having pain. AFTER USE  Rest and breathe slowly and easily.  It can be helpful to keep track of a log of your progress. Your caregiver can provide you with a simple table to help with this. If you are using the spirometer at home, follow these instructions: SEEK MEDICAL CARE IF:   You are having difficultly using the spirometer.  You have trouble using the spirometer as often as instructed.  Your pain medication is not giving enough relief while using the spirometer.  You develop fever of 100.5 F (38.1 C) or higher. SEEK IMMEDIATE MEDICAL CARE IF:   You cough up bloody sputum  that had not been present before.  You develop fever of 102 F (38.9 C) or greater.  You develop worsening pain at or near the incision site. MAKE SURE YOU:   Understand these instructions.  Will watch your condition.  Will get help right away if you are not doing well or get worse. Document Released: 07/19/2006 Document Revised: 05/31/2011 Document Reviewed: 09/19/2006 ExitCare Patient Information 2014 [employer], [place].   ________________________________________________________________________  WHAT IS A BLOOD TRANSFUSION? Blood Transfusion Information  A transfusion is the replacement of blood or some of its parts. Blood is made up of multiple cells which provide different functions.  Red blood cells carry oxygen and are used for blood loss replacement.  White blood cells fight against infection.  Platelets control bleeding.  Plasma helps clot blood.  Other blood products are available for specialized needs, such as hemophilia or other clotting disorders. BEFORE THE TRANSFUSION  Who gives blood for transfusions?   Healthy volunteers who are fully evaluated to make sure their blood is safe. This is blood bank blood. Transfusion therapy is the safest it has ever been in the practice of medicine. Before blood is taken from a donor, a complete history is taken to make sure that person has no history of diseases nor engages in risky social behavior (examples are intravenous drug use or sexual activity with multiple partners). The donor's travel history is screened to minimize risk of transmitting infections, such as malaria. The donated blood is tested for signs of infectious diseases, such as HIV and hepatitis. The blood is then tested to be sure it is compatible with you in order to minimize the chance of a transfusion reaction. If you or a relative donates blood, this is often done in anticipation of surgery and is not appropriate for emergency situations. It takes many days to  process the donated blood. RISKS AND COMPLICATIONS Although transfusion therapy is very safe and saves many lives, the main dangers of transfusion include:   Getting an infectious disease.  Developing a transfusion reaction. This is an allergic reaction to something in the blood you were given. Every precaution is taken to prevent this. The decision to have a  blood transfusion has been considered carefully by your caregiver before blood is given. Blood is not given unless the benefits outweigh the risks. AFTER THE TRANSFUSION  Right after receiving a blood transfusion, you will usually feel much better and more energetic. This is especially true if your red blood cells have gotten low (anemic). The transfusion raises the level of the red blood cells which carry oxygen, and this usually causes an energy increase.  The nurse administering the transfusion will monitor you carefully for complications. HOME CARE INSTRUCTIONS  No special instructions are needed after a transfusion. You may find your energy is better. Speak with your caregiver about any limitations on activity for underlying diseases you may have. SEEK MEDICAL CARE IF:   Your condition is not improving after your transfusion.  You develop redness or irritation at the intravenous (IV) site. SEEK IMMEDIATE MEDICAL CARE IF:  Any of the following symptoms occur over the next 12 hours:  Shaking chills.  You have a temperature by mouth above 102 F (38.9 C), not controlled by medicine.  Chest, back, or muscle pain.  People around you feel you are not acting correctly or are confused.  Shortness of breath or difficulty breathing.  Dizziness and fainting.  You get a rash or develop hives.  You have a decrease in urine output.  Your urine turns a dark color or changes to pink, red, or brown. Any of the following symptoms occur over the next 10 days:  You have a temperature by mouth above 102 F (38.9 C), not controlled by  medicine.  Shortness of breath.  Weakness after normal activity.  The white part of the eye turns yellow (jaundice).  You have a decrease in the amount of urine or are urinating less often.  Your urine turns a dark color or changes to pink, red, or brown. Document Released: 03/05/2000 Document Revised: 05/31/2011 Document Reviewed: 10/23/2007 [facility] Patient Information 2014 [place], [place].  _______________________________________________________________________

## 2017-01-19 NOTE — Progress Notes (Signed)
08-28-16 (EPIC) EKG, CXR

## 2017-01-20 ENCOUNTER — Encounter (HOSPITAL_COMMUNITY): Payer: Self-pay

## 2017-01-20 ENCOUNTER — Encounter (HOSPITAL_COMMUNITY)
Admission: RE | Admit: 2017-01-20 | Discharge: 2017-01-20 | Disposition: A | Discharge status: Home / Self Care | Payer: BLUE CROSS/BLUE SHIELD | Source: Ambulatory Visit | Attending: Orthopedic Surgery | Admitting: Orthopedic Surgery

## 2017-01-20 ENCOUNTER — Ambulatory Visit (HOSPITAL_COMMUNITY)
Admission: RE | Admit: 2017-01-20 | Discharge: 2017-01-20 | Disposition: A | Discharge status: Home / Self Care | Payer: BLUE CROSS/BLUE SHIELD | Source: Ambulatory Visit | Attending: Surgical | Admitting: Surgical

## 2017-01-20 DIAGNOSIS — Z01818 Encounter for other preprocedural examination: Secondary | ICD-10-CM | POA: Diagnosis not present

## 2017-01-20 DIAGNOSIS — M87851 Other osteonecrosis, right femur: Secondary | ICD-10-CM | POA: Insufficient documentation

## 2017-01-20 DIAGNOSIS — Z01812 Encounter for preprocedural laboratory examination: Secondary | ICD-10-CM | POA: Insufficient documentation

## 2017-01-20 DIAGNOSIS — K449 Diaphragmatic hernia without obstruction or gangrene: Secondary | ICD-10-CM | POA: Insufficient documentation

## 2017-01-20 HISTORY — DX: Essential (primary) hypertension: I10

## 2017-01-20 LAB — CBC WITH DIFFERENTIAL/PLATELET
Basophils Absolute: 0 10*3/uL (ref 0.0–0.1)
Basophils Relative: 0 %
Eosinophils Absolute: 0.2 10*3/uL (ref 0.0–0.7)
Eosinophils Relative: 4 %
HCT: 41.6 % (ref 39.0–52.0)
Hemoglobin: 14.2 g/dL (ref 13.0–17.0)
Lymphocytes Relative: 44 %
Lymphs Abs: 1.8 10*3/uL (ref 0.7–4.0)
MCH: 29.6 pg (ref 26.0–34.0)
MCHC: 34.1 g/dL (ref 30.0–36.0)
MCV: 86.7 fL (ref 78.0–100.0)
Monocytes Absolute: 0.5 10*3/uL (ref 0.1–1.0)
Monocytes Relative: 12 %
Neutro Abs: 1.6 10*3/uL — ABNORMAL LOW (ref 1.7–7.7)
Neutrophils Relative %: 40 %
Platelets: 181 10*3/uL (ref 150–400)
RBC: 4.8 MIL/uL (ref 4.22–5.81)
RDW: 14 % (ref 11.5–15.5)
WBC: 4.1 10*3/uL (ref 4.0–10.5)

## 2017-01-20 LAB — COMPREHENSIVE METABOLIC PANEL
ALT: 20 U/L (ref 17–63)
AST: 22 U/L (ref 15–41)
Albumin: 3.8 g/dL (ref 3.5–5.0)
Alkaline Phosphatase: 57 U/L (ref 38–126)
Anion gap: 9 (ref 5–15)
BUN: 11 mg/dL (ref 6–20)
CO2: 25 mmol/L (ref 22–32)
Calcium: 9 mg/dL (ref 8.9–10.3)
Chloride: 108 mmol/L (ref 101–111)
Creatinine, Ser: 0.91 mg/dL (ref 0.61–1.24)
GFR calc Af Amer: >60 mL/min (ref >60)
GFR calc non Af Amer: >60 mL/min (ref >60)
Glucose, Bld: 99 mg/dL (ref 65–99)
Potassium: 3.6 mmol/L (ref 3.5–5.1)
Sodium: 142 mmol/L (ref 135–145)
Total Bilirubin: 0.4 mg/dL (ref 0.3–1.2)
Total Protein: 7.2 g/dL (ref 6.5–8.1)

## 2017-01-20 LAB — SURGICAL PCR SCREEN
MRSA, PCR: NEGATIVE
STAPHYLOCOCCUS AUREUS: NEGATIVE

## 2017-01-20 LAB — APTT: aPTT: 26 seconds (ref 24–36)

## 2017-01-20 LAB — URINALYSIS, ROUTINE W REFLEX MICROSCOPIC
Bilirubin Urine: NEGATIVE
Glucose, UA: NEGATIVE mg/dL
Hgb urine dipstick: NEGATIVE
Ketones, ur: NEGATIVE mg/dL
Leukocytes, UA: NEGATIVE
Nitrite: NEGATIVE
Protein, ur: NEGATIVE mg/dL
Specific Gravity, Urine: 1.016 (ref 1.005–1.030)
pH: 5 (ref 5.0–8.0)

## 2017-01-20 LAB — PROTIME-INR
INR: 0.92
Prothrombin Time: 12.3 seconds (ref 11.4–15.2)

## 2017-01-20 LAB — ABO/RH: ABO/RH(D): A POS

## 2017-01-24 ENCOUNTER — Other Ambulatory Visit: Payer: Self-pay | Admitting: Orthopedic Surgery

## 2017-01-27 NOTE — H&P (Signed)
TOTAL HIP ADMISSION H&P  Patient is admitted for right total hip arthroplasty.  Subjective:  Chief Complaint: right hip pain  HPI: Marco Chapman, 56 y.o. male, has a history of pain and functional disability in the right hip(s) due to arthritis and patient has failed non-surgical conservative treatments for greater than 12 weeks to include NSAID's and/or analgesics, flexibility and strengthening excercises and activity modification.  Onset of symptoms was abrupt starting 1 year ago with rapidlly worsening course since that time.The patient noted no past surgery on the right hip(s).  Patient currently rates pain in the right hip at 7 out of 10 with activity. Patient has night pain, worsening of pain with activity and weight bearing, pain that interfers with activities of daily living and pain with passive range of motion. Patient has evidence of subchondral cysts, subchondral sclerosis, periarticular osteophytes and joint space narrowing by imaging studies. This condition presents safety issues increasing the risk of falls. This patient has had avascular necrosis of the hip.  There is no current active infection.  Patient Active Problem List   Diagnosis Date Noted  . Spinal stenosis, lumbar region with neurogenic claudication 08/27/2016   Past Medical History:  Diagnosis Date  . Hypertension     Past Surgical History:  Procedure Laterality Date  . BACK SURGERY       Current Outpatient Medications  Medication Sig Dispense Refill Last Dose  . amLODipine (NORVASC) 10 MG tablet Take 10 mg by mouth every evening.   08/26/2016 at Unknown time  . calcium carbonate (TUMS - DOSED IN MG ELEMENTAL CALCIUM) 500 MG chewable tablet Chew 2 tablets by mouth daily as needed for indigestion or heartburn.   Past Month at Unknown time  . hydrocortisone cream 1 % Apply 1 application topically daily as needed for itching.     . Ibuprofen-Famotidine (DUEXIS) 800-26.6 MG TABS Take 1 tablet by mouth daily as  needed (pain).    08/21/2016  . lisinopril (PRINIVIL,ZESTRIL) 5 MG tablet Take 5 mg by mouth every evening.   08/26/2016 at Unknown time  . methocarbamol (ROBAXIN) 500 MG tablet Take 1 tablet (500 mg total) by mouth every 8 (eight) hours as needed for muscle spasms. (Patient not taking: Reported on 01/17/2017) 40 tablet 1 Completed Course at Unknown time  . oxyCODONE-acetaminophen (PERCOCET/ROXICET) 5-325 MG tablet Take 1-2 tablets by mouth every 4 (four) hours as needed for moderate pain. (Patient not taking: Reported on 01/17/2017) 60 tablet 0 Completed Course at Unknown time   No Known Allergies  Social History   Tobacco Use  . Smoking status: Never Smoker  . Smokeless tobacco: Never Used  Substance Use Topics  . Alcohol use: Yes    Comment: occas      Review of Systems  Constitutional: Negative.   HENT: Negative.   Eyes: Negative.   Respiratory: Negative.   Cardiovascular: Negative.   Gastrointestinal: Negative.   Genitourinary: Negative.   Musculoskeletal: Positive for joint pain and myalgias. Negative for back pain, falls and neck pain.  Skin: Negative.   Neurological: Negative.   Endo/Heme/Allergies: Negative.   Psychiatric/Behavioral: Negative.     Objective:  Physical Exam  Constitutional: He is oriented to person, place, and time. He appears well-developed and well-nourished. No distress.  HENT:  Head: Normocephalic and atraumatic.  Right Ear: External ear normal.  Left Ear: External ear normal.  Nose: Nose normal.  Mouth/Throat: Oropharynx is clear and moist.  Eyes: Conjunctivae and EOM are normal.  Neck: Normal range of  motion. Neck supple.  Cardiovascular: Normal rate, regular rhythm, normal heart sounds and intact distal pulses.  No murmur heard. Respiratory: Effort normal and breath sounds normal. No respiratory distress. He has no wheezes.  GI: Soft. Bowel sounds are normal. He exhibits no distension. There is no tenderness.  Musculoskeletal:       Right  hip: He exhibits decreased range of motion and decreased strength. He exhibits no tenderness.       Left hip: Normal.       Right knee: Normal.       Left knee: Normal.  Neurological: He is alert and oriented to person, place, and time. He has normal strength. No sensory deficit.  Skin: No rash noted. No erythema.  Psychiatric: He has a normal mood and affect. His behavior is normal.    Vitals Weight: 200 lb Height: 71in Body Surface Area: 2.11 m Body Mass Index: 27.89 kg/m  Pulse: 84 (Regular)  BP: 124/82 (Sitting, Left Arm, Standard)  Imaging Review Plain radiographs demonstrate severe degenerative joint disease of the right hip(s). The bone quality appears to be fair for age and reported activity level.  Assessment/Plan:  Avascular necrosis, right hip(s)  The patient history, physical examination, clinical judgement of the provider and imaging studies are consistent with end stage degenerative joint disease of the right hip(s) and total hip arthroplasty is deemed medically necessary. The treatment options including medical management, injection therapy, arthroscopy and arthroplasty were discussed at length. The risks and benefits of total hip arthroplasty were presented and reviewed. The risks due to aseptic loosening, infection, stiffness, dislocation/subluxation,  thromboembolic complications and other imponderables were discussed.  The patient acknowledged the explanation, agreed to proceed with the plan and consent was signed. Patient is being admitted for inpatient treatment for surgery, pain control, PT, OT, prophylactic antibiotics, VTE prophylaxis, progressive ambulation and ADL's and discharge planning.The patient is planning to be discharged home with home health services    [facility] DME: has walker and 3-n-1 Therapy: HHPT then HEP Home with wife Other: no anesthesia concerns     Dimitri PedAmber Chivonne Rascon, PA-C

## 2017-01-28 ENCOUNTER — Inpatient Hospital Stay (HOSPITAL_COMMUNITY)
Admission: RE | Admit: 2017-01-28 | Discharge: 2017-01-30 | DRG: 470 | Disposition: A | Discharge status: Home Health | Payer: BLUE CROSS/BLUE SHIELD | Source: Ambulatory Visit | Attending: Orthopedic Surgery | Admitting: Orthopedic Surgery

## 2017-01-28 ENCOUNTER — Inpatient Hospital Stay (HOSPITAL_COMMUNITY): Payer: BLUE CROSS/BLUE SHIELD | Admitting: Certified Registered Nurse Anesthetist

## 2017-01-28 ENCOUNTER — Inpatient Hospital Stay (HOSPITAL_COMMUNITY): Payer: BLUE CROSS/BLUE SHIELD

## 2017-01-28 ENCOUNTER — Encounter (HOSPITAL_COMMUNITY): Payer: Self-pay | Admitting: Certified Registered Nurse Anesthetist

## 2017-01-28 ENCOUNTER — Encounter (HOSPITAL_COMMUNITY)
Admission: RE | Disposition: A | Discharge status: Home Health | Payer: Self-pay | Source: Ambulatory Visit | Attending: Orthopedic Surgery

## 2017-01-28 DIAGNOSIS — T8140XA Infection following a procedure, unspecified, initial encounter: Secondary | ICD-10-CM

## 2017-01-28 DIAGNOSIS — X58XXXA Exposure to other specified factors, initial encounter: Secondary | ICD-10-CM | POA: Diagnosis present

## 2017-01-28 DIAGNOSIS — M25751 Osteophyte, right hip: Secondary | ICD-10-CM | POA: Diagnosis present

## 2017-01-28 DIAGNOSIS — Z96641 Presence of right artificial hip joint: Secondary | ICD-10-CM

## 2017-01-28 DIAGNOSIS — S73001A Unspecified subluxation of right hip, initial encounter: Secondary | ICD-10-CM | POA: Diagnosis present

## 2017-01-28 DIAGNOSIS — Z791 Long term (current) use of non-steroidal anti-inflammatories (NSAID): Secondary | ICD-10-CM

## 2017-01-28 DIAGNOSIS — R1110 Vomiting, unspecified: Secondary | ICD-10-CM | POA: Diagnosis not present

## 2017-01-28 DIAGNOSIS — I10 Essential (primary) hypertension: Secondary | ICD-10-CM | POA: Diagnosis present

## 2017-01-28 DIAGNOSIS — Z79899 Other long term (current) drug therapy: Secondary | ICD-10-CM | POA: Diagnosis not present

## 2017-01-28 DIAGNOSIS — T17908A Unspecified foreign body in respiratory tract, part unspecified causing other injury, initial encounter: Secondary | ICD-10-CM

## 2017-01-28 DIAGNOSIS — M48062 Spinal stenosis, lumbar region with neurogenic claudication: Secondary | ICD-10-CM | POA: Diagnosis present

## 2017-01-28 DIAGNOSIS — M1611 Unilateral primary osteoarthritis, right hip: Principal | ICD-10-CM | POA: Diagnosis present

## 2017-01-28 DIAGNOSIS — M8788 Other osteonecrosis, other site: Secondary | ICD-10-CM | POA: Diagnosis present

## 2017-01-28 DIAGNOSIS — Z419 Encounter for procedure for purposes other than remedying health state, unspecified: Secondary | ICD-10-CM

## 2017-01-28 HISTORY — PX: TOTAL HIP ARTHROPLASTY: SHX124

## 2017-01-28 LAB — TYPE AND SCREEN
ABO/RH(D): A POS
Antibody Screen: NEGATIVE

## 2017-01-28 SURGERY — ARTHROPLASTY, HIP, TOTAL,POSTERIOR APPROACH
Anesthesia: Spinal | Site: Hip | Laterality: Right

## 2017-01-28 MED ORDER — HYDROMORPHONE HCL 1 MG/ML IJ SOLN
0.2500 mg | INTRAMUSCULAR | Status: DC | PRN
  Administered 2017-01-28 (×4): 0.5 mg via INTRAVENOUS

## 2017-01-28 MED ORDER — BISACODYL 5 MG PO TBEC: 5.0000 mg | DELAYED_RELEASE_TABLET | Freq: Every day | ORAL | Status: DC | PRN

## 2017-01-28 MED ORDER — FERROUS SULFATE 325 (65 FE) MG PO TABS
325.0000 mg | ORAL_TABLET | Freq: Three times a day (TID) | ORAL | Status: DC
  Administered 2017-01-29 (×3): 325 mg via ORAL
  Filled 2017-01-28 (×3): qty 1

## 2017-01-28 MED ORDER — HYDROCODONE-ACETAMINOPHEN 5-325 MG PO TABS
1.0000 | ORAL_TABLET | ORAL | Status: DC | PRN
  Administered 2017-01-29 – 2017-01-30 (×4): 1 via ORAL
  Filled 2017-01-28 (×5): qty 1

## 2017-01-28 MED ORDER — ORAL CARE MOUTH RINSE
15.0000 mL | Freq: Two times a day (BID) | OROMUCOSAL | Status: DC
  Administered 2017-01-29 – 2017-01-30 (×2): 15 mL via OROMUCOSAL

## 2017-01-28 MED ORDER — HYDROMORPHONE HCL 1 MG/ML IJ SOLN
0.5000 mg | INTRAMUSCULAR | Status: DC | PRN
  Administered 2017-01-28 (×2): 0.5 mg via INTRAVENOUS
  Filled 2017-01-28 (×2): qty 1

## 2017-01-28 MED ORDER — PROPOFOL 500 MG/50ML IV EMUL
INTRAVENOUS | Status: DC | PRN
  Administered 2017-01-28: 100 ug/kg/min via INTRAVENOUS

## 2017-01-28 MED ORDER — ALUM & MAG HYDROXIDE-SIMETH 200-200-20 MG/5ML PO SUSP: 30.0000 mL | ORAL | Status: DC | PRN

## 2017-01-28 MED ORDER — MENTHOL 3 MG MT LOZG: 1.0000 | LOZENGE | OROMUCOSAL | Status: DC | PRN

## 2017-01-28 MED ORDER — METHOCARBAMOL 500 MG PO TABS
500.0000 mg | ORAL_TABLET | Freq: Four times a day (QID) | ORAL | Status: DC | PRN
  Administered 2017-01-30: 09:00:00 500 mg via ORAL
  Filled 2017-01-28: qty 1

## 2017-01-28 MED ORDER — MIDAZOLAM HCL 2 MG/2ML IJ SOLN
INTRAMUSCULAR | Status: AC
  Filled 2017-01-28: qty 2

## 2017-01-28 MED ORDER — SODIUM CHLORIDE 0.9 % IJ SOLN
INTRAMUSCULAR | Status: AC
  Filled 2017-01-28: qty 50

## 2017-01-28 MED ORDER — TRANEXAMIC ACID 1000 MG/10ML IV SOLN
1000.0000 mg | INTRAVENOUS | Status: AC
  Administered 2017-01-28: 1000 mg via INTRAVENOUS
  Filled 2017-01-28: qty 1100

## 2017-01-28 MED ORDER — FLEET ENEMA 7-19 GM/118ML RE ENEM: 1.0000 | ENEMA | Freq: Once | RECTAL | Status: DC | PRN

## 2017-01-28 MED ORDER — PHENYLEPHRINE 40 MCG/ML (10ML) SYRINGE FOR IV PUSH (FOR BLOOD PRESSURE SUPPORT)
PREFILLED_SYRINGE | INTRAVENOUS | Status: DC | PRN
  Administered 2017-01-28 (×14): 80 ug via INTRAVENOUS

## 2017-01-28 MED ORDER — MIDAZOLAM HCL 5 MG/5ML IJ SOLN
INTRAMUSCULAR | Status: DC | PRN
  Administered 2017-01-28 (×2): 2 mg via INTRAVENOUS

## 2017-01-28 MED ORDER — SODIUM CHLORIDE 0.9 % IV SOLN
INTRAVENOUS | Status: DC | PRN
  Administered 2017-01-28: 500 mL

## 2017-01-28 MED ORDER — SUCCINYLCHOLINE CHLORIDE 20 MG/ML IJ SOLN
INTRAMUSCULAR | Status: DC | PRN
  Administered 2017-01-28: 100 mg via INTRAVENOUS

## 2017-01-28 MED ORDER — BUPIVACAINE LIPOSOME 1.3 % IJ SUSP
20.0000 mL | Freq: Once | INTRAMUSCULAR | Status: DC
  Filled 2017-01-28: qty 20

## 2017-01-28 MED ORDER — ACETAMINOPHEN 325 MG PO TABS
650.0000 mg | ORAL_TABLET | ORAL | Status: DC | PRN
  Administered 2017-01-30: 15:00:00 650 mg via ORAL
  Filled 2017-01-28: qty 2

## 2017-01-28 MED ORDER — PROPOFOL 10 MG/ML IV BOLUS
INTRAVENOUS | Status: AC
  Filled 2017-01-28: qty 40

## 2017-01-28 MED ORDER — AMLODIPINE BESYLATE 10 MG PO TABS
10.0000 mg | ORAL_TABLET | Freq: Every evening | ORAL | Status: DC
  Administered 2017-01-28 – 2017-01-29 (×2): 10 mg via ORAL
  Filled 2017-01-28 (×2): qty 1

## 2017-01-28 MED ORDER — CHLORHEXIDINE GLUCONATE 4 % EX LIQD
60.0000 mL | Freq: Once | CUTANEOUS | Status: AC
  Administered 2017-01-28: 4 via TOPICAL

## 2017-01-28 MED ORDER — HYDROMORPHONE HCL 1 MG/ML IJ SOLN
INTRAMUSCULAR | Status: AC
  Administered 2017-01-28: 0.5 mg via INTRAVENOUS
  Filled 2017-01-28: qty 2

## 2017-01-28 MED ORDER — METOPROLOL TARTRATE 5 MG/5ML IV SOLN
INTRAVENOUS | Status: DC | PRN
  Administered 2017-01-28: 1 mg via INTRAVENOUS

## 2017-01-28 MED ORDER — HYDROCODONE-ACETAMINOPHEN 10-325 MG PO TABS
2.0000 | ORAL_TABLET | ORAL | Status: DC | PRN
  Administered 2017-01-28 (×2): 2 via ORAL
  Filled 2017-01-28 (×2): qty 2

## 2017-01-28 MED ORDER — METHOCARBAMOL 1000 MG/10ML IJ SOLN
500.0000 mg | Freq: Four times a day (QID) | INTRAVENOUS | Status: DC | PRN
  Administered 2017-01-28: 500 mg via INTRAVENOUS
  Filled 2017-01-28: qty 550

## 2017-01-28 MED ORDER — SODIUM CHLORIDE 0.9 % IJ SOLN
INTRAMUSCULAR | Status: DC | PRN
  Administered 2017-01-28: 20 mL via INTRAVENOUS

## 2017-01-28 MED ORDER — ACETAMINOPHEN 650 MG RE SUPP: 650.0000 mg | RECTAL | Status: DC | PRN

## 2017-01-28 MED ORDER — PHENOL 1.4 % MT LIQD: 1.0000 | OROMUCOSAL | Status: DC | PRN

## 2017-01-28 MED ORDER — DEXAMETHASONE SODIUM PHOSPHATE 4 MG/ML IJ SOLN
INTRAMUSCULAR | Status: DC | PRN
  Administered 2017-01-28: 10 mg via INTRAVENOUS

## 2017-01-28 MED ORDER — SUCCINYLCHOLINE CHLORIDE 200 MG/10ML IV SOSY
PREFILLED_SYRINGE | INTRAVENOUS | Status: AC
  Filled 2017-01-28: qty 10

## 2017-01-28 MED ORDER — PHENYLEPHRINE 40 MCG/ML (10ML) SYRINGE FOR IV PUSH (FOR BLOOD PRESSURE SUPPORT)
PREFILLED_SYRINGE | INTRAVENOUS | Status: AC
  Filled 2017-01-28: qty 40

## 2017-01-28 MED ORDER — PROPOFOL 10 MG/ML IV BOLUS
INTRAVENOUS | Status: AC
  Filled 2017-01-28: qty 20

## 2017-01-28 MED ORDER — CEFAZOLIN SODIUM-DEXTROSE 1-4 GM/50ML-% IV SOLN
1.0000 g | Freq: Four times a day (QID) | INTRAVENOUS | Status: AC
  Administered 2017-01-28 – 2017-01-29 (×2): 1 g via INTRAVENOUS
  Filled 2017-01-28 (×2): qty 50

## 2017-01-28 MED ORDER — METOCLOPRAMIDE HCL 5 MG/ML IJ SOLN
5.0000 mg | Freq: Three times a day (TID) | INTRAMUSCULAR | Status: DC | PRN
Start: 2017-01-28 — End: 2017-01-30

## 2017-01-28 MED ORDER — OXYCODONE HCL 5 MG/5ML PO SOLN
5.0000 mg | Freq: Once | ORAL | Status: DC | PRN
  Filled 2017-01-28: qty 5

## 2017-01-28 MED ORDER — ONDANSETRON HCL 4 MG/2ML IJ SOLN
4.0000 mg | Freq: Four times a day (QID) | INTRAMUSCULAR | Status: DC | PRN
  Administered 2017-01-29 (×2): 4 mg via INTRAVENOUS
  Filled 2017-01-28 (×2): qty 2

## 2017-01-28 MED ORDER — RIVAROXABAN 10 MG PO TABS
10.0000 mg | ORAL_TABLET | Freq: Every day | ORAL | Status: DC
  Administered 2017-01-29 – 2017-01-30 (×2): 10 mg via ORAL
  Filled 2017-01-28 (×2): qty 1

## 2017-01-28 MED ORDER — LISINOPRIL 5 MG PO TABS
5.0000 mg | ORAL_TABLET | Freq: Every evening | ORAL | Status: DC
  Administered 2017-01-28 – 2017-01-29 (×2): 5 mg via ORAL
  Filled 2017-01-28: qty 2
  Filled 2017-01-28: qty 1

## 2017-01-28 MED ORDER — EPHEDRINE SULFATE-NACL 50-0.9 MG/10ML-% IV SOSY
PREFILLED_SYRINGE | INTRAVENOUS | Status: DC | PRN
  Administered 2017-01-28: 10 mg via INTRAVENOUS

## 2017-01-28 MED ORDER — LACTATED RINGERS IV SOLN
INTRAVENOUS | Status: DC
  Administered 2017-01-28: 11:00:00 via INTRAVENOUS

## 2017-01-28 MED ORDER — FENTANYL CITRATE (PF) 100 MCG/2ML IJ SOLN
INTRAMUSCULAR | Status: DC | PRN
  Administered 2017-01-28: 100 ug via INTRAVENOUS

## 2017-01-28 MED ORDER — SODIUM CHLORIDE 0.9 % IV SOLN
INTRAVENOUS | Status: AC
  Filled 2017-01-28: qty 500000

## 2017-01-28 MED ORDER — ONDANSETRON HCL 4 MG/2ML IJ SOLN
INTRAMUSCULAR | Status: DC | PRN
  Administered 2017-01-28: 4 mg via INTRAVENOUS

## 2017-01-28 MED ORDER — ONDANSETRON HCL 4 MG PO TABS: 4.0000 mg | ORAL_TABLET | Freq: Four times a day (QID) | ORAL | Status: DC | PRN

## 2017-01-28 MED ORDER — POLYETHYLENE GLYCOL 3350 17 G PO PACK: 17.0000 g | PACK | Freq: Every day | ORAL | Status: DC | PRN

## 2017-01-28 MED ORDER — CEFAZOLIN SODIUM-DEXTROSE 2-4 GM/100ML-% IV SOLN
2.0000 g | INTRAVENOUS | Status: AC
  Administered 2017-01-28: 2 g via INTRAVENOUS
  Filled 2017-01-28: qty 100

## 2017-01-28 MED ORDER — METOCLOPRAMIDE HCL 5 MG PO TABS: 5.0000 mg | ORAL_TABLET | Freq: Three times a day (TID) | ORAL | Status: DC | PRN

## 2017-01-28 MED ORDER — LACTATED RINGERS IV SOLN
INTRAVENOUS | Status: DC
  Administered 2017-01-28 (×2): via INTRAVENOUS

## 2017-01-28 MED ORDER — ASPIRIN EC 325 MG PO TBEC: 325.0000 mg | DELAYED_RELEASE_TABLET | Freq: Two times a day (BID) | ORAL | 0 refills | Status: DC

## 2017-01-28 MED ORDER — PHENYLEPHRINE 40 MCG/ML (10ML) SYRINGE FOR IV PUSH (FOR BLOOD PRESSURE SUPPORT)
PREFILLED_SYRINGE | INTRAVENOUS | Status: AC
  Filled 2017-01-28: qty 10

## 2017-01-28 MED ORDER — PROPOFOL 10 MG/ML IV BOLUS
INTRAVENOUS | Status: DC | PRN
  Administered 2017-01-28: 30 mg via INTRAVENOUS

## 2017-01-28 MED ORDER — PROMETHAZINE HCL 25 MG/ML IJ SOLN: 6.2500 mg | INTRAMUSCULAR | Status: DC | PRN

## 2017-01-28 MED ORDER — BUPIVACAINE LIPOSOME 1.3 % IJ SUSP
INTRAMUSCULAR | Status: DC | PRN
  Administered 2017-01-28: 20 mL

## 2017-01-28 MED ORDER — BUPIVACAINE IN DEXTROSE 0.75-8.25 % IT SOLN
INTRATHECAL | Status: DC | PRN
  Administered 2017-01-28: 2 mL via INTRATHECAL

## 2017-01-28 MED ORDER — GLYCOPYRROLATE 0.2 MG/ML IJ SOLN
INTRAMUSCULAR | Status: DC | PRN
  Administered 2017-01-28: 0.2 mg via INTRAVENOUS

## 2017-01-28 MED ORDER — OXYCODONE HCL 5 MG PO TABS: 5.0000 mg | ORAL_TABLET | Freq: Once | ORAL | Status: DC | PRN

## 2017-01-28 MED ORDER — METHOCARBAMOL 500 MG PO TABS
500.0000 mg | ORAL_TABLET | Freq: Three times a day (TID) | ORAL | 1 refills | Status: DC | PRN
Start: 2017-01-28 — End: 2022-04-15

## 2017-01-28 MED ORDER — LACTATED RINGERS IV SOLN
INTRAVENOUS | Status: DC
  Administered 2017-01-28: 17:00:00 via INTRAVENOUS

## 2017-01-28 MED ORDER — FENTANYL CITRATE (PF) 100 MCG/2ML IJ SOLN
INTRAMUSCULAR | Status: AC
  Filled 2017-01-28: qty 2

## 2017-01-28 MED ORDER — SUCCINYLCHOLINE CHLORIDE 20 MG/ML IJ SOLN: INTRAMUSCULAR | Status: DC | PRN

## 2017-01-28 MED ORDER — OXYCODONE-ACETAMINOPHEN 5-325 MG PO TABS: 1.0000 | ORAL_TABLET | ORAL | 0 refills | Status: DC | PRN

## 2017-01-28 SURGICAL SUPPLY — 63 items
BAG ZIPLOCK 12X15 (MISCELLANEOUS) IMPLANT
BLADE SAW SAG 73X25 THK (BLADE) ×1
BLADE SAW SGTL 73X25 THK (BLADE) ×2 IMPLANT
CAPT HIP TOTAL 2 ×3 IMPLANT
COVER SURGICAL LIGHT HANDLE (MISCELLANEOUS) ×3 IMPLANT
DERMABOND ADVANCED (GAUZE/BANDAGES/DRESSINGS)
DERMABOND ADVANCED .7 DNX12 (GAUZE/BANDAGES/DRESSINGS) IMPLANT
DRAPE C-ARM 42X120 X-RAY (DRAPES) ×3 IMPLANT
DRAPE C-ARMOR (DRAPES) ×3 IMPLANT
DRAPE ORTHO SPLIT 77X108 STRL (DRAPES) ×4
DRAPE POUCH INSTRU U-SHP 10X18 (DRAPES) ×3 IMPLANT
DRAPE SURG 17X11 SM STRL (DRAPES) ×3 IMPLANT
DRAPE SURG ORHT 6 SPLT 77X108 (DRAPES) ×2 IMPLANT
DRAPE U-SHAPE 47X51 STRL (DRAPES) ×3 IMPLANT
DRSG ADAPTIC 3X8 NADH LF (GAUZE/BANDAGES/DRESSINGS) IMPLANT
DRSG AQUACEL AG ADV 3.5X10 (GAUZE/BANDAGES/DRESSINGS) IMPLANT
DRSG AQUACEL AG ADV 3.5X14 (GAUZE/BANDAGES/DRESSINGS) ×3 IMPLANT
DRSG TEGADERM 4X4.75 (GAUZE/BANDAGES/DRESSINGS) IMPLANT
DURAPREP 26ML APPLICATOR (WOUND CARE) ×3 IMPLANT
ELECT BLADE TIP CTD 4 INCH (ELECTRODE) ×3 IMPLANT
ELECT REM PT RETURN 15FT ADLT (MISCELLANEOUS) ×3 IMPLANT
EVACUATOR 1/8 PVC DRAIN (DRAIN) IMPLANT
FACESHIELD WRAPAROUND (MASK) ×12 IMPLANT
GAUZE SPONGE 2X2 8PLY STRL LF (GAUZE/BANDAGES/DRESSINGS) IMPLANT
GAUZE SPONGE 4X4 12PLY STRL (GAUZE/BANDAGES/DRESSINGS) IMPLANT
GLOVE BIOGEL PI IND STRL 6.5 (GLOVE) ×1 IMPLANT
GLOVE BIOGEL PI IND STRL 8.5 (GLOVE) ×1 IMPLANT
GLOVE BIOGEL PI INDICATOR 6.5 (GLOVE) ×2
GLOVE BIOGEL PI INDICATOR 8.5 (GLOVE) ×2
GLOVE ECLIPSE 8.0 STRL XLNG CF (GLOVE) ×6 IMPLANT
GLOVE SURG SS PI 6.5 STRL IVOR (GLOVE) IMPLANT
GOWN STRL REUS W/TWL LRG LVL3 (GOWN DISPOSABLE) ×3 IMPLANT
GOWN STRL REUS W/TWL XL LVL3 (GOWN DISPOSABLE) ×3 IMPLANT
GUIDEWIRE BALL NOSE 80CM (WIRE) ×3 IMPLANT
HEMOSTAT SPONGE AVITENE ULTRA (HEMOSTASIS) ×3 IMPLANT
HOOD PEEL AWAY FLYTE STAYCOOL (MISCELLANEOUS) ×3 IMPLANT
IMMOBILIZER KNEE 20 (SOFTGOODS) ×3
IMMOBILIZER KNEE 20 THIGH 36 (SOFTGOODS) ×1 IMPLANT
KIT BASIN OR (CUSTOM PROCEDURE TRAY) ×3 IMPLANT
MANIFOLD NEPTUNE II (INSTRUMENTS) ×3 IMPLANT
MARKER SKIN DUAL TIP RULER LAB (MISCELLANEOUS) ×3 IMPLANT
NDL SAFETY ECLIPSE 18X1.5 (NEEDLE) ×1 IMPLANT
NEEDLE HYPO 18GX1.5 SHARP (NEEDLE) ×2
PACK TOTAL JOINT (CUSTOM PROCEDURE TRAY) ×3 IMPLANT
POSITIONER SURGICAL ARM (MISCELLANEOUS) ×3 IMPLANT
SPONGE GAUZE 2X2 STER 10/PKG (GAUZE/BANDAGES/DRESSINGS)
SPONGE LAP 18X18 X RAY DECT (DISPOSABLE) IMPLANT
SPONGE LAP 4X18 X RAY DECT (DISPOSABLE) ×3 IMPLANT
STAPLER VISISTAT 35W (STAPLE) IMPLANT
SUCTION FRAZIER HANDLE 10FR (MISCELLANEOUS) ×2
SUCTION TUBE FRAZIER 10FR DISP (MISCELLANEOUS) ×1 IMPLANT
SUT MNCRL AB 4-0 PS2 18 (SUTURE) IMPLANT
SUT VIC AB 1 CT1 27 (SUTURE) ×2
SUT VIC AB 1 CT1 27XBRD ANTBC (SUTURE) ×1 IMPLANT
SUT VIC AB 2-0 CT1 27 (SUTURE) ×8
SUT VIC AB 2-0 CT1 TAPERPNT 27 (SUTURE) ×4 IMPLANT
SUT VLOC 180 0 24IN GS25 (SUTURE) ×3 IMPLANT
SYR 20CC LL (SYRINGE) ×3 IMPLANT
SYR BULB IRRIGATION 50ML (SYRINGE) ×3 IMPLANT
TOWEL OR 17X26 10 PK STRL BLUE (TOWEL DISPOSABLE) ×6 IMPLANT
TRAY FOLEY W/METER SILVER 16FR (SET/KITS/TRAYS/PACK) ×3 IMPLANT
WATER STERILE IRR 1000ML POUR (IV SOLUTION) ×3 IMPLANT
YANKAUER SUCT BULB TIP 10FT TU (MISCELLANEOUS) ×3 IMPLANT

## 2017-01-28 NOTE — Anesthesia Procedure Notes (Signed)
Procedure Name: Intubation Date/Time: 01/28/2017 2:20 PM Performed by: Vanessa Durhamochran, Stanley Lyness Glenn, CRNA Pre-anesthesia Checklist: Emergency Drugs available, Suction available, Patient identified and Patient being monitored Patient Re-evaluated:Patient Re-evaluated prior to induction Oxygen Delivery Method: Circle system utilized Preoxygenation: Pre-oxygenation with 100% oxygen Induction Type: IV induction and Rapid sequence Laryngoscope Size: Glidescope and 3 Grade View: Grade I Tube type: Oral Tube size: 7.5 mm Number of attempts: 1 Airway Equipment and Method: Video-laryngoscopy Placement Confirmation: ETT inserted through vocal cords under direct vision,  positive ETCO2 and breath sounds checked- equal and bilateral Secured at: 23 cm Tube secured with: Tape Dental Injury: Teeth and Oropharynx as per pre-operative assessment  Comments: Pt lateral position for procedure.   Intubated with glidescope, with two person technique.  Positive breath sounds, positive co2

## 2017-01-28 NOTE — Transfer of Care (Signed)
Immediate Anesthesia Transfer of Care Note  Patient: Marco Chapman  Procedure(s) Performed: RIGHT TOTAL HIP ARTHROPLASTY (Right Hip)  Patient Location: PACU  Anesthesia Type:General  Level of Consciousness: sedated  Airway & Oxygen Therapy: Patient Spontanous Breathing and Patient connected to face mask oxygen  Post-op Assessment: Report given to RN and Post -op Vital signs reviewed and stable  Post vital signs: Reviewed and stable  Last Vitals:  Vitals:   01/28/17 1040  BP: (!) 142/92  Pulse: (!) 104  Resp: 18  Temp: 37 C  SpO2: 99%    Last Pain:  Vitals:   01/28/17 1222  TempSrc:   PainSc: 3       Patients Stated Pain Goal: 4 (01/28/17 1222)  Complications: No apparent anesthesia complications

## 2017-01-28 NOTE — Op Note (Signed)
[name]:  [name], [name]             ACCOUNT NO.:  [id number]  MEDICAL RECORD NO.:  [id number]  LOCATION:                                 FACILITY:  PHYSICIAN:  [name], M.D.DATE OF BIRTH:  Mar 18, 1961  DATE OF PROCEDURE:  01/28/2017 DATE OF DISCHARGE:                              OPERATIVE REPORT   SURGEON:  Georges Lynchonald A. [name], M.D.  ASSISTANTS:  [name], MD and [name], [place].  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  OPERATION:  A right total hip arthroplasty utilizing the DePuy system. I utilized for the femoral component, a high offset size 6 Tri-Lock stem.  The neck length was a +5.  The ball was a 36 mm diameter ceramic ball.  The cup size was a size 60 with the +4 polyethylene insert.  The procedure under first spinal anesthesia followed by general anesthesia. The patient first had 1 g of tranexamic acid.  He had 2 g of IV Ancef. Note, we first did a spinal on him, but he was extremely restless.  He obviously had some food in his stomach, so there were no signs of any aspiration, but he was suctioned out, at this time by Anesthesia and given a general anesthetic with intubation.  The patient was on the left side, right side up.  A routine orthopedic prep and draping of the right hip was carried out.  The appropriate time-out was carried out, I also marked the appropriate right leg in the holding area.  At this time, posterior lateral approach to the hip was carried out, bleeders identified and cauterized.  I then partially detached the external rotators.  I then did a capsulectomy, dislocated the head and then amputated the femoral head at the appropriate neck length.  Note,  the neck was severely deformed.  There was marked osteophyte formation. This was all cleaned out very nicely.  I then utilized a box osteotome, followed by the widening reamer.  Note, the canal was extremely tight and deformed proximally, so just to be careful I brought the C-arm in and then we proceeded.  We were able to use a size 6 rasp.  We took x- rays with rasp in place and rasp was well located within the femoral canal and there was no penetration of the canal.  At this time, after we rasped the canal down to the appropriate size, we then irrigated it and packed it with a large sponge, which was later removed.  Next, attention was directed to the acetabulum, we completed our capsulectomy.  We then reamed the acetabulum up to a size 59.  We reamed more medially as well because of the subluxation laterally.  Once we were down to good bleeding bone, we elected then to use a Gription cup size 60.  It was inserted into the acetabulum.  No screws were necessary because we really could not remove the cup.  After this, we went back and put  our +6 high offset Tri-Lock stem in and then went through trials once again with the trial head and neck length and finally selected a ceramic +5 ball and then reduced the hip and had excellent function.  We could not dislocate the hip.  We had excellent stability.  We then noted the leg lengths were they fine.  We tried our best to establish the leg lengths. He was about a half inch short due to this subluxation.  I thoroughly irrigated out the area, loosely applied some Gelfoam.  I then closed the wound layers in usual fashion.  Prior to closure, we injected 20 mL of Exparel mixed with 20 mL of normal saline. The sterile dressings were applied.  The patient left the operating room in satisfactory condition.          ______________________________ Georges Lynchonald A. [name], M.D.     RAG/MEDQ  D:  01/28/2017  T:  01/28/2017  Job:  [id number]

## 2017-01-28 NOTE — Anesthesia Preprocedure Evaluation (Signed)
Anesthesia Evaluation  Patient identified by MRN, date of birth, ID band Patient awake    Reviewed: Allergy & Precautions, NPO status , Patient's Chart, lab work & pertinent test results  Airway Mallampati: I       Dental no notable dental hx. (+) Teeth Intact   Pulmonary    Pulmonary exam normal breath sounds clear to auscultation       Cardiovascular hypertension, Pt. on medications Normal cardiovascular exam Rhythm:Regular Rate:Normal     Neuro/Psych negative neurological ROS  negative psych ROS   GI/Hepatic negative GI ROS, Neg liver ROS,   Endo/Other  negative endocrine ROS  Renal/GU negative Renal ROS     Musculoskeletal   Abdominal Normal abdominal exam  (+)   Peds  Hematology negative hematology ROS (+)   Anesthesia Other Findings   Reproductive/Obstetrics                             Anesthesia Physical  Anesthesia Plan  ASA: II  Anesthesia Plan: Spinal   Post-op Pain Management:    Induction: Intravenous  PONV Risk Score and Plan: 2 and Ondansetron, Propofol, Midazolam and Treatment may vary due to age  Airway Management Planned: Simple Face Mask  Additional Equipment:   Intra-op Plan:   Post-operative Plan:   Informed Consent: I have reviewed the patients History and Physical, chart, labs and discussed the procedure including the risks, benefits and alternatives for the proposed anesthesia with the patient or authorized representative who has indicated his/her understanding and acceptance.   Dental advisory given  Plan Discussed with: CRNA and Surgeon  Anesthesia Plan Comments:         Anesthesia Quick Evaluation

## 2017-01-28 NOTE — Anesthesia Postprocedure Evaluation (Signed)
Anesthesia Post Note  Patient: Marco Chapman  Procedure(s) Performed: RIGHT TOTAL HIP ARTHROPLASTY (Right Hip)     Patient location during evaluation: PACU Anesthesia Type: Spinal Level of consciousness: awake and alert Pain management: pain level controlled Vital Signs Assessment: post-procedure vital signs reviewed and stable Respiratory status: spontaneous breathing and respiratory function stable Cardiovascular status: blood pressure returned to baseline and stable Postop Assessment: spinal receding Anesthetic complications: no Comments: Spinal converted to GETA for airway protection due to reflux of gastric contents.     Last Vitals:  Vitals:   01/28/17 2000 01/28/17 2100  BP: 121/77 127/84  Pulse: (!) 115 (!) 102  Resp: 15 18  Temp:    SpO2: 97% 92%    Last Pain:  Vitals:   01/28/17 2100  TempSrc:   PainSc: 7                  Lewie LoronJohn Jerome Viglione

## 2017-01-28 NOTE — Interval H&P Note (Signed)
History and Physical Interval Note:  01/28/2017 12:57 PM  Marco Chapman  has presented today for surgery, with the diagnosis of Right hip avascular necrosis   The various methods of treatment have been discussed with the patient and family. After consideration of risks, benefits and other options for treatment, the patient has consented to  Procedure(s): RIGHT TOTAL HIP ARTHROPLASTY (Right) as a surgical intervention .  The patient's history has been reviewed, patient examined, no change in status, stable for surgery.  I have reviewed the patient's chart and labs.  Questions were answered to the patient's satisfaction.     Ndidi Nesby A

## 2017-01-28 NOTE — Discharge Instructions (Signed)

## 2017-01-28 NOTE — Brief Op Note (Signed)
01/28/2017  2:46 PM  PATIENT:  Marco Chapman  56 y.o. male  PRE-OPERATIVE DIAGNOSIS:  Right hip avascular necrosis   POST-OPERATIVE DIAGNOSIS:  Right hip avascular necrosis   PROCEDURE:  Procedure(s): RIGHT TOTAL HIP ARTHROPLASTY (Right)  SURGEON:  Surgeon(s) and Role:    Ranee Gosselin* Dwayna Kentner, MD - Primary    * Swinteck, Arlys JohnBrian, MD - Assisting  PHYSICIAN ASSISTANT:Amber Maustononstable PA  ASSISTANTS: Loreli DollarBrian Swintek MD and [name] PA  ANESTHESIA:   spinal and then General  EBL:  350 mL   BLOOD ADMINISTERED:none  DRAINS: none   LOCAL MEDICATIONS USED:  OTHER 20cc of Exparel mixed with 20cc of Normal Saline  SPECIMEN:  No Specimen  DISPOSITION OF SPECIMEN:  N/A  COUNTS:  YES  TOURNIQUET:  * No tourniquets in log *  DICTATION: .Other Dictation: Dictation Number [id number]  PLAN OF CARE: Admit to inpatient   PATIENT DISPOSITION:  PACU - hemodynamically stable.   Delay start of Pharmacological VTE agent (>24hrs) due to surgical blood loss or risk of bleeding: yes

## 2017-01-28 NOTE — Anesthesia Procedure Notes (Signed)
Spinal  Patient location during procedure: OR Staffing Anesthesiologist: Nolon Nations, MD Performed: anesthesiologist  Preanesthetic Checklist Completed: patient identified, site marked, surgical consent, pre-op evaluation, timeout performed, IV checked, risks and benefits discussed and monitors and equipment checked Spinal Block Patient position: sitting Prep: ChloraPrep and site prepped and draped Patient monitoring: heart rate, continuous pulse ox and blood pressure Approach: right paramedian Location: L3-4 Injection technique: single-shot Needle Needle type: Sprotte  Needle gauge: 24 G Needle length: 9 cm Assessment Sensory level: T8 Additional Notes Expiration date of kit checked and confirmed. Patient tolerated procedure well, without complications.

## 2017-01-29 ENCOUNTER — Other Ambulatory Visit: Payer: Self-pay

## 2017-01-29 LAB — BASIC METABOLIC PANEL
ANION GAP: 6 (ref 5–15)
BUN: 14 mg/dL (ref 6–20)
CALCIUM: 8.3 mg/dL — AB (ref 8.9–10.3)
CO2: 28 mmol/L (ref 22–32)
Chloride: 105 mmol/L (ref 101–111)
Creatinine, Ser: 1.02 mg/dL (ref 0.61–1.24)
Glucose, Bld: 119 mg/dL — ABNORMAL HIGH (ref 65–99)
Potassium: 3.7 mmol/L (ref 3.5–5.1)
Sodium: 139 mmol/L (ref 135–145)

## 2017-01-29 LAB — CBC
HEMATOCRIT: 36.3 % — AB (ref 39.0–52.0)
Hemoglobin: 11.8 g/dL — ABNORMAL LOW (ref 13.0–17.0)
MCH: 28.7 pg (ref 26.0–34.0)
MCHC: 32.5 g/dL (ref 30.0–36.0)
MCV: 88.3 fL (ref 78.0–100.0)
Platelets: 197 10*3/uL (ref 150–400)
RBC: 4.11 MIL/uL — ABNORMAL LOW (ref 4.22–5.81)
RDW: 14 % (ref 11.5–15.5)
WBC: 12.7 10*3/uL — AB (ref 4.0–10.5)

## 2017-01-29 NOTE — Evaluation (Signed)
Physical Therapy Evaluation Patient Details Name: Marco Chapman MRN: [id number] DOB: 02/06/1961 Today's Date: 01/29/2017   History of Present Illness  R posterior THA  Clinical Impression  The patient  Has been nauseated but did ambulate x 60' with RW.  The  Right leg is very internally rotated in bed and during gait. Frequent cues to attempt more neutral position of Right leg during swing and stance. Pt admitted with above diagnosis. Pt currently with functional limitations due to the deficits listed below (see PT Problem List).  Pt will benefit from skilled PT to increase their independence and safety with mobility to allow discharge to the venue listed below.       Follow Up Recommendations Home health PT    Equipment Recommendations  None recommended by PT    Recommendations for Other Services       Precautions / Restrictions Precautions Precautions: Fall;Posterior Hip Restrictions RLE Weight Bearing: Partial weight bearing RLE Partial Weight Bearing Percentage or Pounds: 50      Mobility  Bed Mobility Overal bed mobility: Needs Assistance Bed Mobility: Supine to Sit     Supine to sit: Mod assist     General bed mobility comments: decreased  right hip flexion increased difficulty for sitting up. Right leg very internally rotated. frequent cues for patient to make effort to not  rotate inward.  Transfers Overall transfer level: Needs assistance Equipment used: Rolling walker (2 wheeled) Transfers: Sit to/from Stand Sit to Stand: Min assist         General transfer comment: cues for hand and right leg position  Ambulation/Gait Ambulation/Gait assistance: Min assist Ambulation Distance (Feet): 60 Feet Assistive device: Rolling walker (2 wheeled) Gait Pattern/deviations: Step-to pattern;Decreased step length - right     General Gait Details: decreased control of right leg during swing with noted internal rotation, patient made attempts with some success of  more neutral  position of right leg during swing.   Stairs            Wheelchair Mobility    Modified Rankin (Stroke Patients Only)       Balance                                             Pertinent Vitals/Pain Pain Assessment: 0-10 Pain Score: 4  Pain Location: right Pain Descriptors / Indicators: Aching Pain Intervention(s): Monitored during session;Premedicated before session;Ice applied    Home Living Family/patient expects to be discharged to:: Private residence Living Arrangements: Spouse/significant other Available Help at Discharge: Family Type of Home: House Home Access: Stairs to enter Entrance Stairs-Rails: None Entrance Stairs-Number of Steps: 2 Home Layout: Two level;Able to live on main level with bedroom/bathroom Home Equipment: Dan HumphreysWalker - 2 wheels;Bedside commode      Prior Function Level of Independence: Independent               Hand Dominance   Dominant Hand: Right    Extremity/Trunk Assessment   Upper Extremity Assessment Upper Extremity Assessment: Overall WFL for tasks assessed    Lower Extremity Assessment Lower Extremity Assessment: RLE deficits/detail RLE Deficits / Details: internally rotated in bed, internally rotated during gait.    Cervical / Trunk Assessment Cervical / Trunk Assessment: Normal  Communication   Communication: No difficulties  Cognition Arousal/Alertness: Awake/alert Behavior During Therapy: WFL for tasks assessed/performed Overall Cognitive Status: Within Functional Limits  for tasks assessed                                        General Comments      Exercises Total Joint Exercises Ankle Circles/Pumps: AROM;Right;10 reps Heel Slides: AAROM;Right;15 reps Hip ABduction/ADduction: AAROM;Right;15 reps   Assessment/Plan    PT Assessment Patient needs continued PT services  PT Problem List Decreased strength;Decreased range of motion;Decreased knowledge of  use of DME;Decreased activity tolerance;Decreased safety awareness;Decreased mobility;Decreased knowledge of precautions       PT Treatment Interventions DME instruction;Therapeutic exercise;Gait training;Stair training;Functional mobility training;Therapeutic activities;Patient/family education    PT Goals (Current goals can be found in the Care Plan section)  Acute Rehab PT Goals Patient Stated Goal: agreed to ambulate PT Goal Formulation: With patient Time For Goal Achievement: 02/02/17 Potential to Achieve Goals: Good    Frequency 7X/week   Barriers to discharge        Co-evaluation               AM-PAC PT "6 Clicks" Daily Activity  Outcome Measure Difficulty turning over in bed (including adjusting bedclothes, sheets and blankets)?: Unable Difficulty moving from lying on back to sitting on the side of the bed? : Unable Difficulty sitting down on and standing up from a chair with arms (e.g., wheelchair, bedside commode, etc,.)?: Unable Help needed moving to and from a bed to chair (including a wheelchair)?: Total Help needed walking in hospital room?: Total Help needed climbing 3-5 steps with a railing? : Total 6 Click Score: 6    End of Session   Activity Tolerance: Patient tolerated treatment well Patient left: in chair;with call bell/phone within reach Nurse Communication: Mobility status PT Visit Diagnosis: Unsteadiness on feet (R26.81);Difficulty in walking, not elsewhere classified (R26.2);Pain Pain - Right/Left: Right Pain - part of body: Hip    Time: 1135-1202 PT Time Calculation (min) (ACUTE ONLY): 27 min   Charges:   PT Evaluation $PT Eval Low Complexity: 1 Low PT Treatments $Gait Training: 8-22 mins   PT G CodesBlanchard Kelch:        Ambry Dix PT [phone]   Rada HayHill, Cloie Wooden Elizabeth 01/29/2017, 1:37 PM

## 2017-01-29 NOTE — Progress Notes (Signed)
Physical Therapy Treatment Patient Details Name: Marco HissCurtis L Warmack MRN: [id number] DOB: 02/20/1961 Today's Date: 01/29/2017    History of Present Illness R posterior THA    PT Comments     Improved control of the right leg during gait.. Pillow placed between legs to  assist more neutral position of right leg. Continue PT.  Follow Up Recommendations  Home health PT     Equipment Recommendations  None recommended by PT    Recommendations for Other Services       Precautions / Restrictions Precautions Precautions: Fall;Posterior Hip Restrictions RLE Weight Bearing: Partial weight bearing RLE Partial Weight Bearing Percentage or Pounds: 50    Mobility  Bed Mobility Overal bed mobility: Needs Assistance Bed Mobility: Sit to Supine     Supine to sit: Mod assist     General bed mobility comments: cues for technique,assist with the right leg onto the bed  Transfers Overall transfer level: Needs assistance Equipment used: Rolling walker (2 wheeled) Transfers: Sit to/from Stand Sit to Stand: Min guard         General transfer comment: cues for hand and right leg position  Ambulation/Gait Ambulation/Gait assistance: Min guard Ambulation Distance (Feet): 60 Feet Assistive device: Rolling walker (2 wheeled) Gait Pattern/deviations: Step-to pattern;Decreased step length - right     General Gait Details: improved control of the right leg during seing. Cues for precautions   Stairs            Wheelchair Mobility    Modified Rankin (Stroke Patients Only)       Balance                                            Cognition Arousal/Alertness: Awake/alert Behavior During Therapy: WFL for tasks assessed/performed Overall Cognitive Status: Within Functional Limits for tasks assessed                                        Exercises    General Comments        Pertinent Vitals/Pain Pain Assessment: 0-10 Pain Score: 3   Pain Location: right hip Pain Descriptors / Indicators: Discomfort Pain Intervention(s): Premedicated before session;Ice applied;Monitored during session    Home Living Family/patient expects to be discharged to:: Private residence Living Arrangements: Spouse/significant other Available Help at Discharge: Family Type of Home: House Home Access: Stairs to enter Entrance Stairs-Rails: None Home Layout: Two level;Able to live on main level with bedroom/bathroom Home Equipment: Dan HumphreysWalker - 2 wheels;Bedside commode      Prior Function Level of Independence: Independent          PT Goals (current goals can now be found in the care plan section) Acute Rehab PT Goals Patient Stated Goal: agreed to ambulate PT Goal Formulation: With patient Time For Goal Achievement: 02/02/17 Potential to Achieve Goals: Good Progress towards PT goals: Progressing toward goals    Frequency    7X/week      PT Plan Current plan remains appropriate    Co-evaluation              AM-PAC PT "6 Clicks" Daily Activity  Outcome Measure  Difficulty turning over in bed (including adjusting bedclothes, sheets and blankets)?: Unable Difficulty moving from lying on back to sitting on the side of the bed? :  Unable Difficulty sitting down on and standing up from a chair with arms (e.g., wheelchair, bedside commode, etc,.)?: Unable Help needed moving to and from a bed to chair (including a wheelchair)?: Total Help needed walking in hospital room?: Total Help needed climbing 3-5 steps with a railing? : Total 6 Click Score: 6    End of Session   Activity Tolerance: Patient tolerated treatment well Patient left: in bed;with call bell/phone within reach;with family/visitor present Nurse Communication: Mobility status PT Visit Diagnosis: Unsteadiness on feet (R26.81);Difficulty in walking, not elsewhere classified (R26.2);Pain Pain - Right/Left: Right Pain - part of body: Hip     Time: [id number] PT  Time Calculation (min) (ACUTE ONLY): 19 min  Charges:  $Gait Training: 8-22 mins                    G CodesBlanchard Kelch:       Saliou Barnier PT [phone]   Rada HayHill, Mackensey Bolte Elizabeth 01/29/2017, 3:49 PM

## 2017-01-29 NOTE — Progress Notes (Signed)
Subjective: 1 Day Post-Op Procedure(s) (LRB): RIGHT TOTAL HIP ARTHROPLASTY (Right) Patient reports pain as 2 on 0-10 scale.He is doing very well this morning. No respiratory issues. His lungs are clear and his temp is 99.     Objective: Vital signs in last 24 hours: Temp:  [98.4 F (36.9 C)-99.7 F (37.6 C)] 99.4 F (37.4 C) (11/10 0344) Pulse Rate:  [52-121] 99 (11/10 0600) Resp:  [11-25] 13 (11/10 0600) BP: (97-156)/(63-92) 125/81 (11/10 0600) SpO2:  [91 %-100 %] 95 % (11/10 0600) Weight:  [93 kg (205 lb)] 93 kg (205 lb) (11/09 1125)  Intake/Output from previous day: 11/09 0701 - 11/10 0700 In: 2855 [I.V.:2750; IV Piggyback:105] Out: 1430 [Urine:1080; Blood:350] Intake/Output this shift: No intake/output data recorded.  Recent Labs    01/29/17 0333  HGB 11.8*   Recent Labs    01/29/17 0333  WBC 12.7*  RBC 4.11*  HCT 36.3*  PLT 197   Recent Labs    01/29/17 0333  NA 139  K 3.7  CL 105  CO2 28  BUN 14  CREATININE 1.02  GLUCOSE 119*  CALCIUM 8.3*   No results for input(s): LABPT, INR in the last 72 hours.  Dorsiflexion/Plantar flexion intact No cellulitis present Compartment soft  Assessment/Plan: 1 Day Post-Op Procedure(s) (LRB): RIGHT TOTAL HIP ARTHROPLASTY (Right) Up with therapy.Will transfer to Ortho floor. Will repeat his Chest Xray in the morning. Plan on DC tomorrow if no respiratory issues and see next Thursday in the office.  Aylinn Rydberg A 01/29/2017, 7:19 AM

## 2017-01-29 NOTE — Care Management Note (Signed)
Case Management Note  Patient Details  Name: Marco Chapman MRN: [id number] Date of Birth: 05/26/1960  Subjective/Objective:     Right THA               Action/Plan: Discharge Planning: NCM spoke to pt and wife, [name]. [facility] Pt has RW and bedside commode at home. Requesting cane for home. Contacted [facility] for cane for home.     Expected Discharge Date:  01/29/17               Expected Discharge Plan:  Home w Home Health Services  In-House Referral:  NA  Discharge planning Services  CM Consult  Post Acute Care Choice:  Home Health Choice offered to:  Spouse  DME Arranged:  Gilmer Morane DME Agency:  [facility]  HH Arranged:  PT HH [facility]  Status of Service:  Completed, signed off  If discussed at [employer] Length of Stay Meetings, dates discussed:    Additional Comments:  Elliot CousinShavis, [name] Ellen, RN 01/29/2017, 11:09 AM

## 2017-01-30 ENCOUNTER — Inpatient Hospital Stay (HOSPITAL_COMMUNITY): Payer: BLUE CROSS/BLUE SHIELD

## 2017-01-30 LAB — CBC
HEMATOCRIT: 33.5 % — AB (ref 39.0–52.0)
HEMOGLOBIN: 11 g/dL — AB (ref 13.0–17.0)
MCH: 28.9 pg (ref 26.0–34.0)
MCHC: 32.8 g/dL (ref 30.0–36.0)
MCV: 88.2 fL (ref 78.0–100.0)
Platelets: 201 10*3/uL (ref 150–400)
RBC: 3.8 MIL/uL — ABNORMAL LOW (ref 4.22–5.81)
RDW: 13.9 % (ref 11.5–15.5)
WBC: 12.3 10*3/uL — AB (ref 4.0–10.5)

## 2017-01-30 LAB — BASIC METABOLIC PANEL
ANION GAP: 9 (ref 5–15)
BUN: 21 mg/dL — ABNORMAL HIGH (ref 6–20)
CALCIUM: 8.4 mg/dL — AB (ref 8.9–10.3)
CHLORIDE: 101 mmol/L (ref 101–111)
CO2: 25 mmol/L (ref 22–32)
Creatinine, Ser: 1.07 mg/dL (ref 0.61–1.24)
GFR calc non Af Amer: >60 mL/min (ref >60)
GLUCOSE: 124 mg/dL — AB (ref 65–99)
Potassium: 3.6 mmol/L (ref 3.5–5.1)
Sodium: 135 mmol/L (ref 135–145)

## 2017-01-30 MED ORDER — SODIUM CHLORIDE 0.9 % IV BOLUS (SEPSIS)
500.0000 mL | Freq: Once | INTRAVENOUS | Status: AC
  Administered 2017-01-30: 500 mL via INTRAVENOUS

## 2017-01-30 NOTE — Evaluation (Signed)
Occupational Therapy Evaluation Patient Details Name: Marco HissCurtis L Frasco MRN: [id number] DOB: 04/12/1960 Today's Date: 01/30/2017    History of Present Illness 56 yo male s/p R THA-posterior11/9/18   Clinical Impression   Reinforced hip precautions and PWB and how to apply to his self care activities. Practiced toilet transfer with RW this session and demonstrated shower transfer. Will continue to follow to progress independence with self care tasks.    Follow Up Recommendations  No OT follow up;Supervision - Intermittent    Equipment Recommendations  None recommended by OT    Recommendations for Other Services       Precautions / Restrictions Precautions Precautions: Fall;Posterior Hip Precaution Booklet Issued: Yes (comment) Precaution Comments: Reviewed hip precautions. Pt able to state 2/3 on his own and did state PWB status. Restrictions Weight Bearing Restrictions: Yes RLE Weight Bearing: Partial weight bearing RLE Partial Weight Bearing Percentage or Pounds: 50      Mobility Bed Mobility      General bed mobility comments: pt in chair.  Transfers Overall transfer level: Needs assistance Equipment used: Rolling walker (2 wheeled) Transfers: Sit to/from Stand Sit to Stand: Min guard         General transfer comment: min cues for hand placement and LE management.    Balance                                           ADL either performed or assessed with clinical judgement   ADL Overall ADL's : Needs assistance/impaired Eating/Feeding: Independent;Sitting   Grooming: Wash/dry hands;Set up;Sitting   Upper Body Bathing: Set up;Sitting   Lower Body Bathing: Moderate assistance;Sit to/from stand   Upper Body Dressing : Set up;Sitting   Lower Body Dressing: Maximal assistance;Sit to/from stand Lower Body Dressing Details (indicate cue type and reason): without AE Toilet Transfer: Min guard;Ambulation;BSC;RW   Toileting- Designer, fashion/clothingClothing  Manipulation and Hygiene: Min guard;Sit to/from stand         General ADL Comments: Educated on PWB status and reviewed all hip precautions. Educated  and demonstrated all AE options but pt states his wife will likely assist with LB dressing rather than obtaining AE. Educated on need to stand to perform toilet hygiene so he doesnt break his hip precautions. Min cues for keeping R LE from turning inward with functional transfers. Demonstrated shower transfer and issued handout and pt verbalized understanding of technique. Pt has a shower chair.      Vision Patient Visual Report: No change from baseline       Perception     Praxis      Pertinent Vitals/Pain Pain Assessment: 0-10 Pain Score: 3  Faces Pain Scale: Hurts little more Pain Location: R hip Pain Descriptors / Indicators: Sore Pain Intervention(s): Monitored during session;Ice applied     Hand Dominance Right   Extremity/Trunk Assessment Upper Extremity Assessment Upper Extremity Assessment: Overall WFL for tasks assessed           Communication Communication Communication: No difficulties   Cognition Arousal/Alertness: Awake/alert Behavior During Therapy: WFL for tasks assessed/performed Overall Cognitive Status: Within Functional Limits for tasks assessed                                     General Comments       Exercises  Shoulder Instructions      Home Living Family/patient expects to be discharged to:: Private residence Living Arrangements: Spouse/significant other Available Help at Discharge: Family Type of Home: House Home Access: Stairs to enter Entergy CorporationEntrance Stairs-Number of Steps: 2 Entrance Stairs-Rails: None Home Layout: Two level;Able to live on main level with bedroom/bathroom   Alternate Level Stairs-Rails: Right Bathroom Shower/Tub: Producer, television/film/videoWalk-in shower   Bathroom Toilet: Standard     Home Equipment: Environmental consultantWalker - 2 wheels;Bedside commode;Shower seat          Prior  Functioning/Environment Level of Independence: Independent                 OT Problem List: Decreased strength;Decreased knowledge of use of DME or AE;Decreased knowledge of precautions      OT Treatment/Interventions: Self-care/ADL training;DME and/or AE instruction;Therapeutic activities;Patient/family education    OT Goals(Current goals can be found in the care plan section) Acute Rehab OT Goals Patient Stated Goal: to increase independence.  OT Goal Formulation: With patient Time For Goal Achievement: 02/06/17 Potential to Achieve Goals: Good  OT Frequency: Min 2X/week   Barriers to D/C:            Co-evaluation              AM-PAC PT "6 Clicks" Daily Activity     Outcome Measure Help from another person eating meals?: None Help from another person taking care of personal grooming?: A Little Help from another person toileting, which includes using toliet, bedpan, or urinal?: A Little Help from another person bathing (including washing, rinsing, drying)?: A Little Help from another person to put on and taking off regular upper body clothing?: None Help from another person to put on and taking off regular lower body clothing?: A Lot 6 Click Score: 19   End of Session Equipment Utilized During Treatment: Rolling walker  Activity Tolerance: Patient tolerated treatment well Patient left: in chair;with call bell/phone within reach  OT Visit Diagnosis: Muscle weakness (generalized) (M62.81)                Time: [id number] OT Time Calculation (min): 30 min Charges:  OT General Charges $OT Visit: 1 Visit OT Evaluation $OT Eval Low Complexity: 1 Low OT Treatments $Therapeutic Activity: 8-22 mins G-Codes:       Zannie KehrStephanie S Jacorey Donaway 01/30/2017, 12:47 PM

## 2017-01-30 NOTE — Progress Notes (Signed)
Physical Therapy Treatment Patient Details Name: Marco Chapman MRN: [id number] DOB: 04/19/1960 Today's Date: 01/30/2017    History of Present Illness 56 yo male s/p R THA-posterior11/9/18    PT Comments    Progressing with mobility. Continued cueing and reviewing of hip precautions and WB status. Practiced gait training and stair training. Issued stair negotiation handout for family to review if needed. Discussed car transfer technique. Pt stated he may d/c home later today. All education completed.    Follow Up Recommendations  Home health PT     Equipment Recommendations  None recommended by PT    Recommendations for Other Services       Precautions / Restrictions Precautions Precautions: Fall;Posterior Hip Precaution Booklet Issued: Yes (comment) Precaution Comments: Reviewed hip precautions.  Restrictions Weight Bearing Restrictions: Yes RLE Weight Bearing: Partial weight bearing RLE Partial Weight Bearing Percentage or Pounds: 50    Mobility  Bed Mobility Overal bed mobility: Needs Assistance Bed Mobility: Sit to Supine      Sit to supine: Min assist;HOB elevated   General bed mobility comments: Assist for R LE. Verbal and tactile cues given for safe technique.   Transfers Overall transfer level: Needs assistance Equipment used: Rolling walker (2 wheeled) Transfers: Sit to/from Stand Sit to Stand: Min guard         General transfer comment: close guard for safety. VCs adherence to precautions, hand/LE placement. Increased time.   Ambulation/Gait Ambulation/Gait assistance: Min guard Ambulation Distance (Feet): 75 Feet Assistive device: Rolling walker (2 wheeled) Gait Pattern/deviations: Step-to pattern;Decreased step length - right;Decreased stride length     General Gait Details: close guard for safety. Cues for adherence to precautions and foot/LE positioning. Slow gait speed.    Stairs Stairs: Yes Min Assist Stair Management:  Backwards;With walker;Step to pattern Number of Stairs: 2 General stair comments: Assist to stabilize walker. VCs for safety, technique, sequence.   Wheelchair Mobility    Modified Rankin (Stroke Patients Only)       Balance                                            Cognition Arousal/Alertness: Awake/alert Behavior During Therapy: WFL for tasks assessed/performed Overall Cognitive Status: Within Functional Limits for tasks assessed                                        Exercises Total Joint Exercises Ankle Circles/Pumps: AROM;Right;10 reps Quad Sets: AROM;Both;10 reps Heel Slides: AAROM;Right;10 reps;Supine Hip ABduction/ADduction: AAROM;Right;10 reps;Supine    General Comments        Pertinent Vitals/Pain Pain Assessment: Faces Pain Score: 3  Faces Pain Scale: Hurts little more Pain Location: R hip Pain Descriptors / Indicators: Sore Pain Intervention(s): Monitored during session;Ice applied;Repositioned    Home Living Family/patient expects to be discharged to:: Private residence Living Arrangements: Spouse/significant other Available Help at Discharge: Family Type of Home: House Home Access: Stairs to enter Entrance Stairs-Rails: None Home Layout: Two level;Able to live on main level with bedroom/bathroom Home Equipment: Dan HumphreysWalker - 2 wheels;Bedside commode;Shower seat      Prior Function Level of Independence: Independent          PT Goals (current goals can now be found in the care plan section) Acute Rehab PT Goals Patient Stated Goal:  to increase independence.  Progress towards PT goals: Progressing toward goals    Frequency    7X/week      PT Plan Current plan remains appropriate    Co-evaluation              AM-PAC PT "6 Clicks" Daily Activity  Outcome Measure  Difficulty turning over in bed (including adjusting bedclothes, sheets and blankets)?: Unable Difficulty moving from lying on back to  sitting on the side of the bed? : Unable Difficulty sitting down on and standing up from a chair with arms (e.g., wheelchair, bedside commode, etc,.)?: Unable Help needed moving to and from a bed to chair (including a wheelchair)?: A Little Help needed walking in hospital room?: A Little Help needed climbing 3-5 steps with a railing? : A Little 6 Click Score: 12    End of Session Equipment Utilized During Treatment: Gait belt Activity Tolerance: Patient tolerated treatment well Patient left: in bed;with call bell/phone within reach   PT Visit Diagnosis: Muscle weakness (generalized) (M62.81);Difficulty in walking, not elsewhere classified (R26.2);Pain Pain - Right/Left: Right Pain - part of body: Hip     Time: [id number] PT Time Calculation (min) (ACUTE ONLY): 18 min  Charges:  $Gait Training: 8-22 mins                    G Codes:         Rebeca AlertJannie Jenny Omdahl, MPT Pager: [phone]

## 2017-01-30 NOTE — Progress Notes (Signed)
   Subjective: 2 Days Post-Op Procedure(s) (LRB): RIGHT TOTAL HIP ARTHROPLASTY (Right)  Pt had 2 episodes of vomiting and according to nursing staff it was coffee ground emesis Right hip pain is mild and pt denies any reason for the vomiting Pt states he feels suddenly warm and then he vomits Denies any nausea currently Denies any prior history of GI issues Patient reports pain as mild.  Objective:   VITALS:   Vitals:   01/29/17 2130 01/30/17 0548  BP: 137/82 124/78  Pulse: (!) 125 (!) 122  Resp: 16 16  Temp: 100.2 F (37.9 C) 99.4 F (37.4 C)  SpO2: 96% 97%    Right hip incision healing well nv intact distally No rashes or edema distally  LABS Recent Labs    01/29/17 0333 01/30/17 0444  HGB 11.8* 11.0*  HCT 36.3* 33.5*  WBC 12.7* 12.3*  PLT 197 201    Recent Labs    01/29/17 0333 01/30/17 0444  NA 139 135  K 3.7 3.6  BUN 14 21*  CREATININE 1.02 1.07  GLUCOSE 119* 124*     Assessment/Plan: 2 Days Post-Op Procedure(s) (LRB): RIGHT TOTAL HIP ARTHROPLASTY (Right) Continue to monitor pt today Chest x-ray reordered as STAT to recheck aspiration especially after recent vomiting episodes Continue PT/OT Will discuss d/c as long as chest x-rays is improved and no other episodes of vomiting Pt in agreement Pain management as needed    Alphonsa OverallBrad Dixon, MPAS, PA-C  01/30/2017, 7:39 AM

## 2017-01-30 NOTE — Progress Notes (Signed)
Physical Therapy Treatment Patient Details Name: Marco Chapman MRN: [id number] DOB: 05/14/1960 Today's Date: 01/30/2017    History of Present Illness 56 yo male s/p R THA-posterior11/9/18    PT Comments    Progressing well with mobility. Reviewed hip precautions and PWB status-issued handout. Will plan to practice stair negotiation this afternoon. Unsure if pt will d/c today or tomorrow.    Follow Up Recommendations  Home health PT     Equipment Recommendations  None recommended by PT    Recommendations for Other Services       Precautions / Restrictions Precautions Precautions: Fall;Posterior Hip Precaution Booklet Issued: Yes (comment) Precaution Comments: Reviewed posterior precautios -pt unable to recall, and PWB status Restrictions Weight Bearing Restrictions: Yes RLE Weight Bearing: Partial weight bearing RLE Partial Weight Bearing Percentage or Pounds: 50    Mobility  Bed Mobility Overal bed mobility: Needs Assistance Bed Mobility: Supine to Sit     Supine to sit: Min assist;HOB elevated     General bed mobility comments: Assist for R LE. Cues for technique, adherence to precautions. Increased time.   Transfers Overall transfer level: Needs assistance Equipment used: Rolling walker (2 wheeled) Transfers: Sit to/from Stand Sit to Stand: From elevated surface;Min guard         General transfer comment: close guard for safety. VCs safety, technique, hand/LE placement.   Ambulation/Gait Ambulation/Gait assistance: Min guard Ambulation Distance (Feet): 100 Feet Assistive device: Rolling walker (2 wheeled) Gait Pattern/deviations: Step-to pattern;Decreased step length - right;Decreased stride length     General Gait Details: close guard for safety. Cues for adherence to precautions and foot/LE positioning. Slow gait speed.    Stairs            Wheelchair Mobility    Modified Rankin (Stroke Patients Only)       Balance                                            Cognition Arousal/Alertness: Awake/alert Behavior During Therapy: WFL for tasks assessed/performed Overall Cognitive Status: Within Functional Limits for tasks assessed                                        Exercises Total Joint Exercises Ankle Circles/Pumps: AROM;Right;10 reps Quad Sets: AROM;Both;10 reps Heel Slides: AAROM;Right;10 reps;Supine Hip ABduction/ADduction: AAROM;Right;10 reps;Supine    General Comments        Pertinent Vitals/Pain Pain Assessment: Faces Faces Pain Scale: Hurts little more Pain Location: R hip/LE Pain Descriptors / Indicators: Sore Pain Intervention(s): Limited activity within patient's tolerance;Premedicated before session;Ice applied    Home Living                      Prior Function            PT Goals (current goals can now be found in the care plan section) Progress towards PT goals: Progressing toward goals    Frequency    7X/week      PT Plan Current plan remains appropriate    Co-evaluation              AM-PAC PT "6 Clicks" Daily Activity  Outcome Measure  Difficulty turning over in bed (including adjusting bedclothes, sheets and blankets)?: Unable Difficulty moving from lying on  back to sitting on the side of the bed? : Unable Difficulty sitting down on and standing up from a chair with arms (e.g., wheelchair, bedside commode, etc,.)?: Unable Help needed moving to and from a bed to chair (including a wheelchair)?: A Little Help needed walking in hospital room?: A Little Help needed climbing 3-5 steps with a railing? : A Little 6 Click Score: 12    End of Session Equipment Utilized During Treatment: Gait belt Activity Tolerance: Patient tolerated treatment well Patient left: in chair;with call bell/phone within reach;with family/visitor present   PT Visit Diagnosis: Muscle weakness (generalized) (M62.81);Difficulty in walking, not  elsewhere classified (R26.2);Pain Pain - Right/Left: Right Pain - part of body: Hip     Time: 1000-1019 PT Time Calculation (min) (ACUTE ONLY): 19 min  Charges:  $Gait Training: 8-22 mins                    G Codes:          Rebeca AlertJannie Skylen Danielsen, MPT Pager: [phone]

## 2017-02-01 ENCOUNTER — Encounter (HOSPITAL_COMMUNITY): Payer: Self-pay | Admitting: Orthopedic Surgery

## 2017-02-01 NOTE — Discharge Summary (Signed)
Physician Discharge Summary    Patient ID: Marco Chapman MRN: [id number] DOB/AGE: December 20, 1960 56 y.o.  Admit date: 01/28/2017 Discharge date: 01/30/2017   Procedures:  Procedure(s) (LRB): RIGHT TOTAL HIP ARTHROPLASTY (Right)  Attending Physician:  Dr. Ranee Gosselin   Admission Diagnoses:   Right hip end stage osteoarthritis  Consultants:  PT/OT  Discharge Diagnoses:  Active Problems:   H/O total hip arthroplasty, right  Past Medical History:  Diagnosis Date  . Hypertension      PCP: Patient, No Pcp Per   Discharged Condition: stable  Hospital Course:  Patient underwent the above stated procedure on 01/28/2017. Patient tolerated the procedure well and brought to the recovery room in good condition and subsequently to the floor. Pt had several episodes of vomiting and actually aspirated after surgery. Serial chest x-rays were monitored prior to d/c home. No complicating wound issues during the hospital stay. Incision healing well and good early range of motion  Disposition: 01-Home or Self Care with follow up in 2 weeks  Medications: No current facility-administered medications for this encounter.    Current Outpatient Medications  Medication Sig Dispense Refill  . amLODipine (NORVASC) 10 MG tablet Take 10 mg by mouth every evening.    . calcium carbonate (TUMS - DOSED IN MG ELEMENTAL CALCIUM) 500 MG chewable tablet Chew 2 tablets by mouth daily as needed for indigestion or heartburn.    . hydrocortisone cream 1 % Apply 1 application topically daily as needed for itching.    . Ibuprofen-Famotidine (DUEXIS) 800-26.6 MG TABS Take 1 tablet by mouth daily as needed (pain).     Marland Kitchen lisinopril (PRINIVIL,ZESTRIL) 5 MG tablet Take 5 mg by mouth every evening.    Marland Kitchen aspirin EC 325 MG tablet Take 1 tablet (325 mg total) 2 (two) times daily by mouth. 30 tablet 0  . methocarbamol (ROBAXIN) 500 MG tablet Take 1 tablet (500 mg total) every 8 (eight) hours as needed by mouth for muscle  spasms. 40 tablet 1  . oxyCODONE-acetaminophen (PERCOCET/ROXICET) 5-325 MG tablet Take 1-2 tablets every 4 (four) hours as needed by mouth for moderate pain. 60 tablet 0    Follow-up Information    Ranee Gosselin, MD. Schedule an appointment as soon as possible for a visit in 2 week(s).   Specialty:  Orthopedic Surgery Contact information: [address] [place] [address] [phone]        [facility] Follow up.   Specialty:  Home Health Services Why:  Home Health Physical Therapy- agency will call to arrange initial appointment Contact information: [address] [place] [address] [phone]           Discharge Instructions    Call MD / Call 911   Complete by:  As directed    If you experience chest pain or shortness of breath, CALL 911 and be transported to the hospital emergency room.  If you develope a fever above 101 F, pus (white drainage) or increased drainage or redness at the wound, or calf pain, call your surgeon's office.   Constipation Prevention   Complete by:  As directed    Drink plenty of fluids.  Prune juice may be helpful.  You may use a stool softener, such as Colace (over the counter) 100 mg twice a day.  Use MiraLax (over the counter) for constipation as needed.   Diet - low sodium heart healthy   Complete by:  As directed    Discharge instructions  Complete by:  As directed    INSTRUCTIONS AFTER JOINT REPLACEMENT   Remove items at home which could result in a fall. This includes throw rugs or furniture in walking pathways ICE to the affected joint every three hours while awake for 30 minutes at a time, for at least the first 3-5 days, and then as needed for pain and swelling.  Continue to use ice for pain and swelling. You may notice swelling that will progress down to the foot and ankle.  This is normal after surgery.  Elevate your leg when you are not up walking on it.   Continue to use the breathing  machine you got in the hospital (incentive spirometer) which will help keep your temperature down.  It is common for your temperature to cycle up and down following surgery, especially at night when you are not up moving around and exerting yourself.  The breathing machine keeps your lungs expanded and your temperature down.   DIET:  As you were doing prior to hospitalization, we recommend a well-balanced diet.  DRESSING / WOUND CARE / SHOWERING  Keep the surgical dressing until follow up.  The dressing is water proof, so you can shower without any extra covering.  IF THE DRESSING FALLS OFF or the wound gets wet inside, change the dressing with sterile gauze.  Please use good hand washing techniques before changing the dressing.  Do not use any lotions or creams on the incision until instructed by your surgeon.    ACTIVITY  Increase activity slowly as tolerated, but follow the weight bearing instructions below.   No driving for 6 weeks or until further direction given by your physician.  You cannot drive while taking narcotics.  No lifting or carrying greater than 10 lbs. until further directed by your surgeon. Avoid periods of inactivity such as sitting longer than an hour when not asleep. This helps prevent blood clots.  You may return to work once you are authorized by your doctor.     WEIGHT BEARING   Weight bearing as tolerated with assist device (walker, cane, etc) as directed, use it as long as suggested by your surgeon or therapist, typically at least 4-6 weeks.   EXERCISES  Results after joint replacement surgery are often greatly improved when you follow the exercise, range of motion and muscle strengthening exercises prescribed by your doctor. Safety measures are also important to protect the joint from further injury. Any time any of these exercises cause you to have increased pain or swelling, decrease what you are doing until you are comfortable again and then slowly increase  them. If you have problems or questions, call your caregiver or physical therapist for advice.   Rehabilitation is important following a joint replacement. After just a few days of immobilization, the muscles of the leg can become weakened and shrink (atrophy).  These exercises are designed to build up the tone and strength of the thigh and leg muscles and to improve motion. Often times heat used for twenty to thirty minutes before working out will loosen up your tissues and help with improving the range of motion but do not use heat for the first two weeks following surgery (sometimes heat can increase post-operative swelling).   These exercises can be done on a training (exercise) mat, on the floor, on a table or on a bed. Use whatever works the best and is most comfortable for you.    Use music or television while you are exercising so  that the exercises are a pleasant break in your day. This will make your life better with the exercises acting as a break in your routine that you can look forward to.   Perform all exercises about fifteen times, three times per day or as directed.  You should exercise both the operative leg and the other leg as well.  Exercises include:   Quad Sets - Tighten up the muscle on the front of the thigh (Quad) and hold for 5-10 seconds.   Straight Leg Raises - With your knee straight (if you were given a brace, keep it on), lift the leg to 60 degrees, hold for 3 seconds, and slowly lower the leg.  Perform this exercise against resistance later as your leg gets stronger.  Leg Slides: Lying on your back, slowly slide your foot toward your buttocks, bending your knee up off the floor (only go as far as is comfortable). Then slowly slide your foot back down until your leg is flat on the floor again.  Angel Wings: Lying on your back spread your legs to the side as far apart as you can without causing discomfort.  Hamstring Strength:  Lying on your back, push your heel against the  floor with your leg straight by tightening up the muscles of your buttocks.  Repeat, but this time bend your knee to a comfortable angle, and push your heel against the floor.  You may put a pillow under the heel to make it more comfortable if necessary.   A rehabilitation program following joint replacement surgery can speed recovery and prevent re-injury in the future due to weakened muscles. Contact your doctor or a physical therapist for more information on knee rehabilitation.    CONSTIPATION  Constipation is defined medically as fewer than three stools per week and severe constipation as less than one stool per week.  Even if you have a regular bowel pattern at home, your normal regimen is likely to be disrupted due to multiple reasons following surgery.  Combination of anesthesia, postoperative narcotics, change in appetite and fluid intake all can affect your bowels.   YOU MUST use at least one of the following options; they are listed in order of increasing strength to get the job done.  They are all available over the counter, and you may need to use some, POSSIBLY even all of these options:    Drink plenty of fluids (prune juice may be helpful) and high fiber foods Colace 100 mg by mouth twice a day  Senokot for constipation as directed and as needed Dulcolax (bisacodyl), take with full glass of water  Miralax (polyethylene glycol) once or twice a day as needed.  If you have tried all these things and are unable to have a bowel movement in the first 3-4 days after surgery call either your surgeon or your primary doctor.    If you experience loose stools or diarrhea, hold the medications until you stool forms back up.  If your symptoms do not get better within 1 week or if they get worse, check with your doctor.  If you experience "the worst abdominal pain ever" or develop nausea or vomiting, please contact the office immediately for further recommendations for treatment.   ITCHING:   If you experience itching with your medications, try taking only a single pain pill, or even half a pain pill at a time.  You can also use Benadryl over the counter for itching or also to help with sleep.  TED HOSE STOCKINGS:  Use stockings on both legs until for at least 2 weeks or as directed by physician office. They may be removed at night for sleeping.  MEDICATIONS:  See your medication summary on the "After Visit Summary" that nursing will review with you.  You may have some home medications which will be placed on hold until you complete the course of blood thinner medication.  It is important for you to complete the blood thinner medication as prescribed.  PRECAUTIONS:  If you experience chest pain or shortness of breath - call 911 immediately for transfer to the hospital emergency department.   If you develop a fever greater that 101 F, purulent drainage from wound, increased redness or drainage from wound, foul odor from the wound/dressing, or calf pain - CONTACT YOUR SURGEON.                                                   FOLLOW-UP APPOINTMENTS:  If you do not already have a post-op appointment, please call the office for an appointment to be seen by your surgeon.  Guidelines for how soon to be seen are listed in your "After Visit Summary", but are typically between 1-4 weeks after surgery.   MAKE SURE YOU:  Understand these instructions.  Get help right away if you are not doing well or get worse.    Thank you for letting us be a part of your medical care team.  It is a privilege we respect greatly.  We hope these instructions will help you stay on track for a fast and full recovery!   Follow the hip precautions as taught in Physical Therapy   Complete by:  As directed    Increase activity slowly as tolerated   Complete by:  As directed       Allergies as of 01/30/2017   No Known Allergies     Medication List    TAKE these medications   amLODipine 10 MG  tablet Commonly known as:  NORVASC Take 10 mg by mouth every evening.   aspirin EC 325 MG tablet Take 1 tablet (325 mg total) 2 (two) times daily by mouth.   calcium carbonate 500 MG chewable tablet Commonly known as:  TUMS - dosed in mg elemental calcium Chew 2 tablets by mouth daily as needed for indigestion or heartburn.   DUEXIS 800-26.6 MG Tabs Generic drug:  Ibuprofen-Famotidine Take 1 tablet by mouth daily as needed (pain).   hydrocortisone cream 1 % Apply 1 application topically daily as needed for itching.   lisinopril 5 MG tablet Commonly known as:  PRINIVIL,ZESTRIL Take 5 mg by mouth every evening.   methocarbamol 500 MG tablet Commonly known as:  ROBAXIN Take 1 tablet (500 mg total) every 8 (eight) hours as needed by mouth for muscle spasms.   oxyCODONE-acetaminophen 5-325 MG tablet Commonly known as:  PERCOCET/ROXICET Take 1-2 tablets every 4 (four) hours as needed by mouth for moderate pain.       Signed: Thea Gist 02/01/2017, 7:30 AM

## 2019-01-23 IMAGING — DX DG CHEST 2V
2 series · 2 of 2 positions shown · non-contrast
Comparison: Chest x-ray of August 20, 2016

CLINICAL DATA: Preoperative examination for right total hip joint
prosthesis placement. History of hypertension. Nonsmoker.

EXAM:
CHEST  2 VIEW

[chest pa]
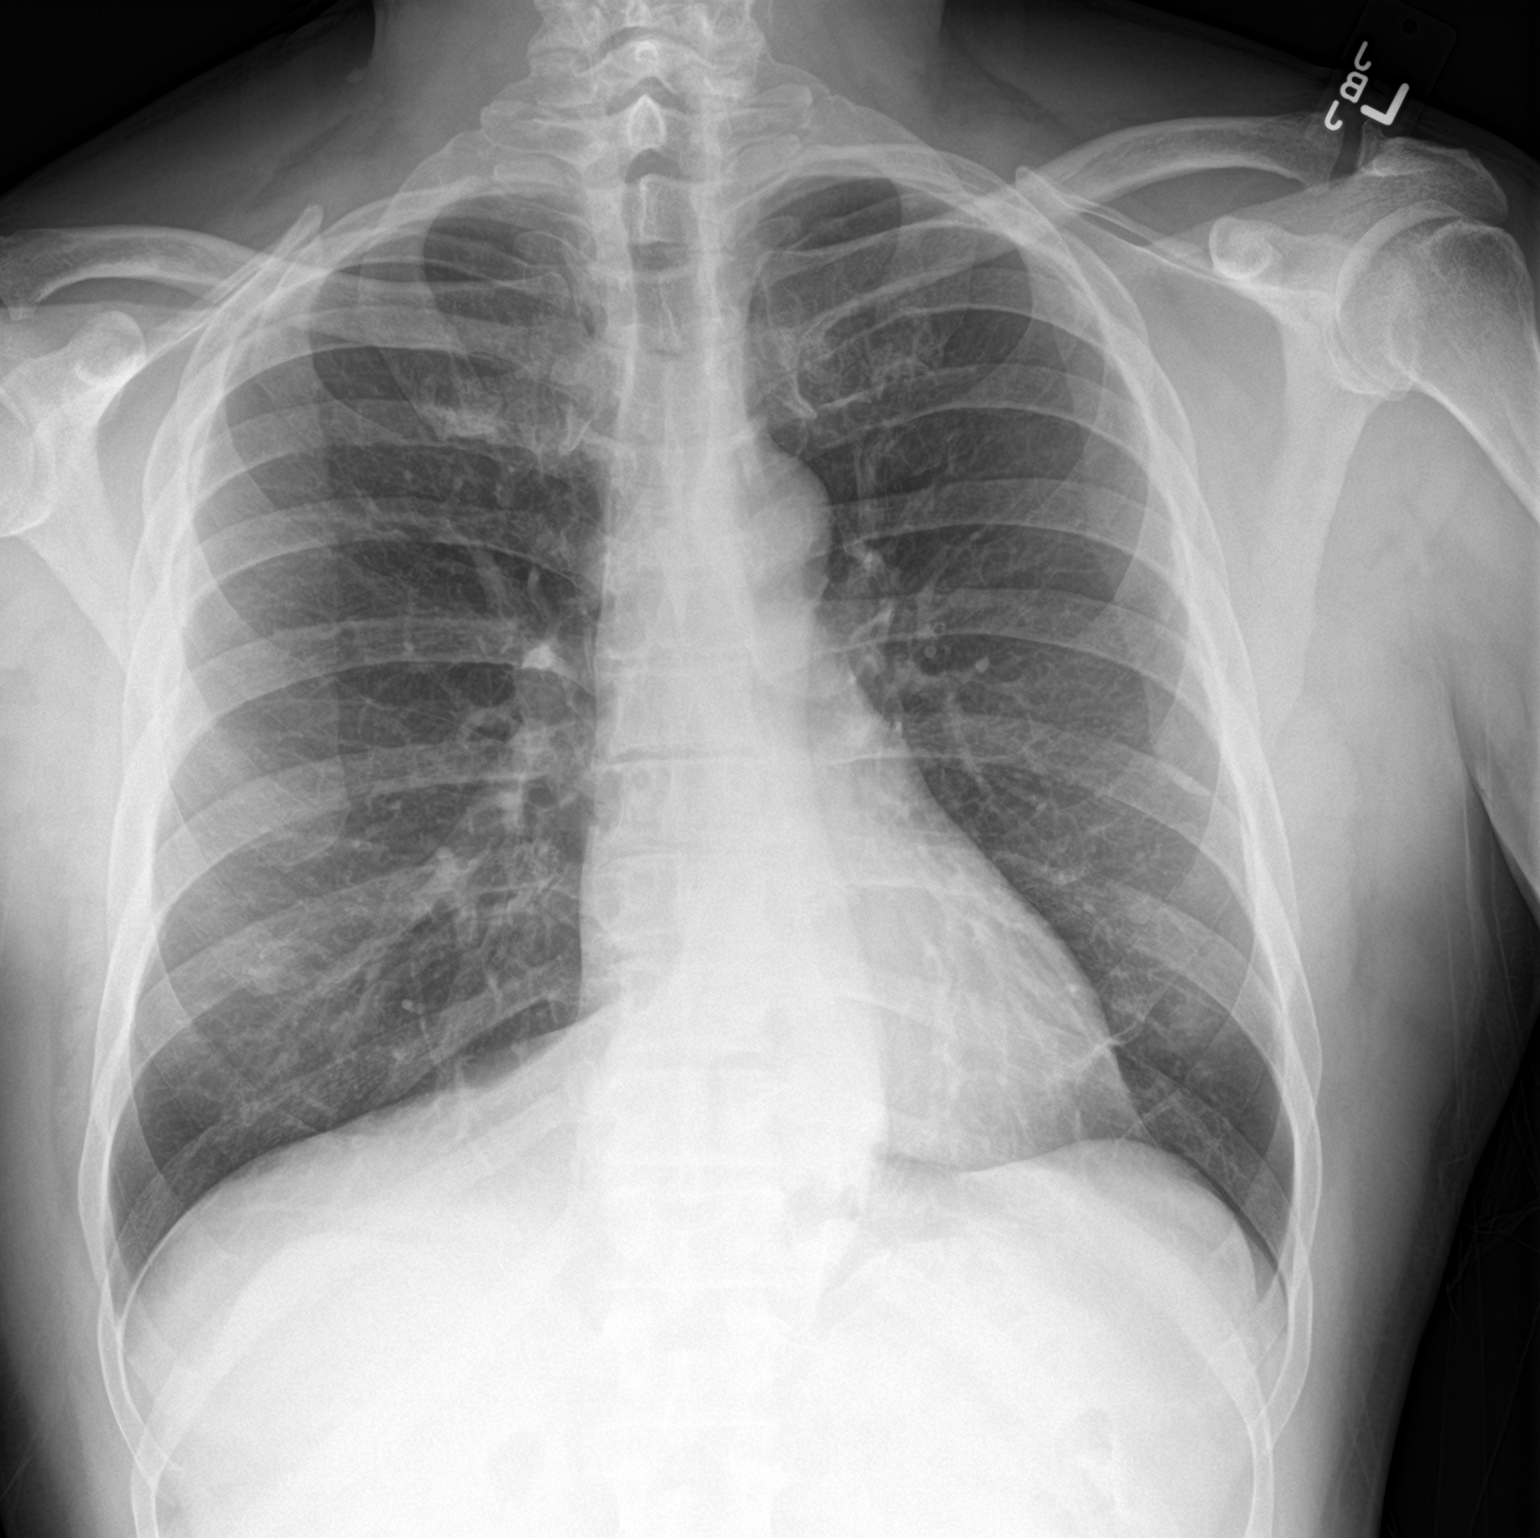

[chest lat]
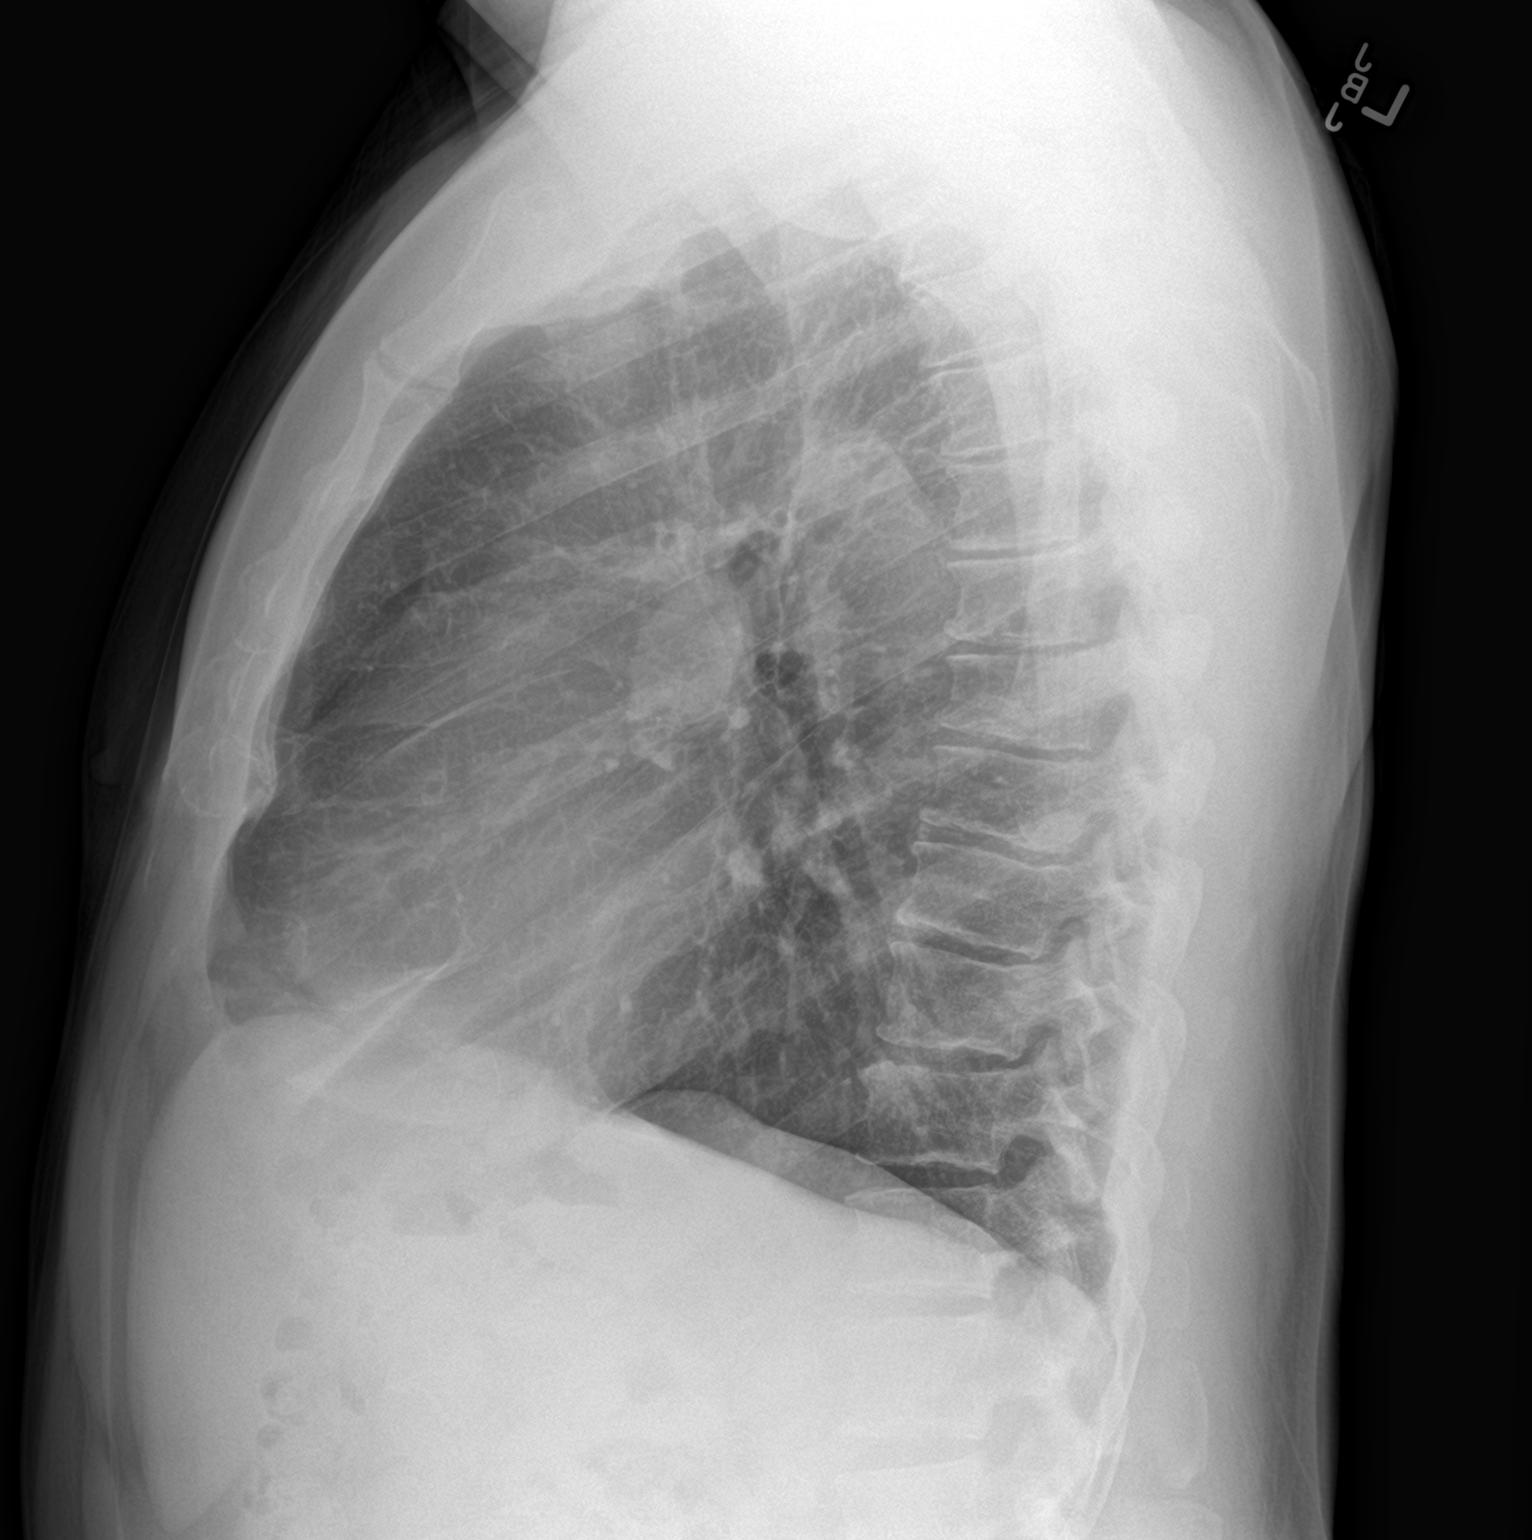

[2 of 2 positions shown; findings below may reference images not displayed]

FINDINGS: The lungs are adequately inflated and clear. Nipple shadows are
visible bilaterally. The heart and pulmonary vascularity are normal.
There is a small hiatal hernia. The mediastinum is normal in width.
There is mild multilevel degenerative disc disease of the thoracic
spine.
IMPRESSION: There is no acute cardiopulmonary abnormality.

Small hiatal hernia.

## 2019-01-31 IMAGING — DX DG CHEST 1V PORT
1 series · 1 of 1 positions shown · non-contrast
Comparison: Preoperative chest radiographs 01/20/2017.

ADDENDUM:
Study discussed by telephone with RN Squires in the PACU on 01/28/2017 at
2223 hours.
CLINICAL DATA: 56-year-old male status post right hip replacement.
Possible aspiration.

EXAM:
PORTABLE CHEST 1 VIEW

[chest ap]
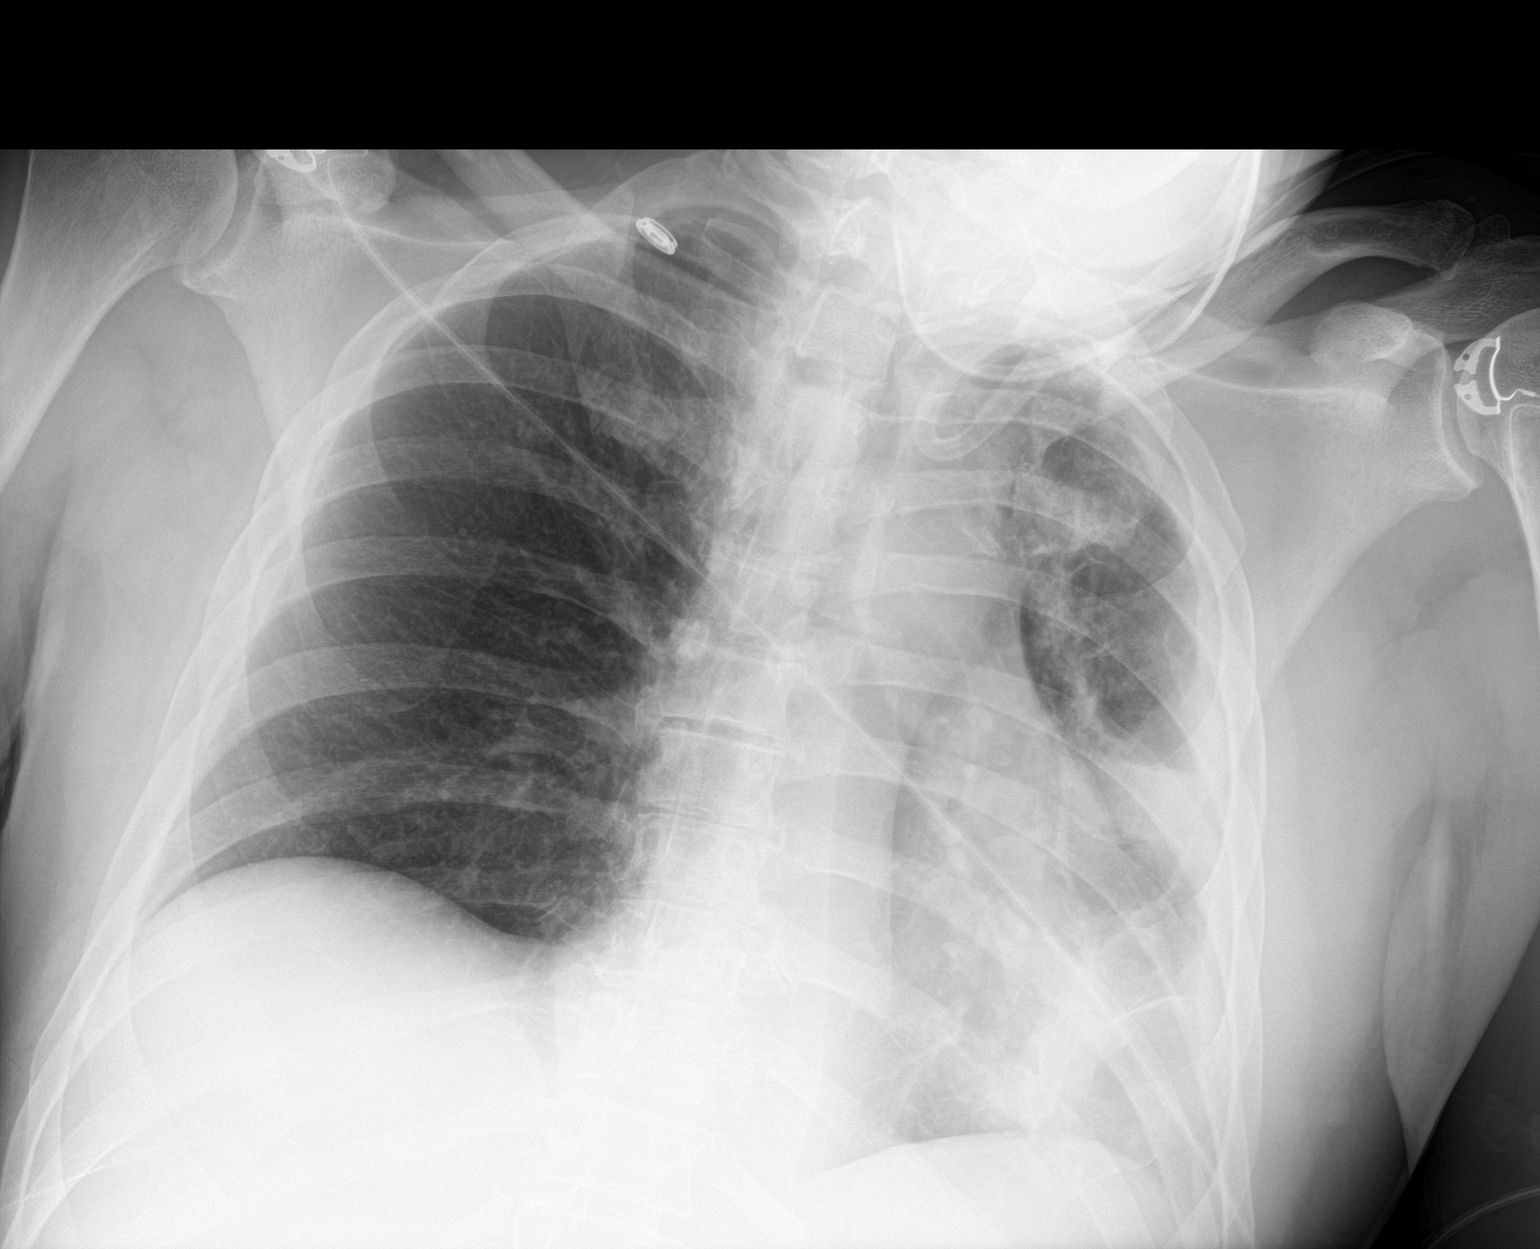

[1 of 1 positions shown; findings below may reference images not displayed]

FINDINGS: Portable AP semi upright view at 0507 hours. Confluent areas of
abnormal left lung pulmonary opacity about the hilum and lung base.
Associated decreased left lung volume with some leftward shift of
the mediastinum. The right lung appears clear. No pneumothorax or
pleural effusion.
IMPRESSION: Abnormal left lung volume loss and opacity suspected to reflect a
combination of multifocal aspiration and atelectasis in this
clinical setting.

## 2019-06-02 ENCOUNTER — Ambulatory Visit: Payer: BC Managed Care – PPO | Attending: Internal Medicine

## 2019-06-02 DIAGNOSIS — Z23 Encounter for immunization: Secondary | ICD-10-CM

## 2019-06-02 NOTE — Progress Notes (Signed)
   Covid-19 Vaccination Clinic  Name:  Marco Chapman    MRN: [id number] DOB: September 28, 1960  06/02/2019  [name] was observed post Covid-19 immunization for 15 minutes without incident. He was provided with Vaccine Information Sheet and instruction to access the V-Safe system.   [name] was instructed to call 911 with any severe reactions post vaccine: Marland Kitchen Difficulty breathing  . Swelling of face and throat  . A fast heartbeat  . A bad rash all over body  . Dizziness and weakness   Immunizations Administered    Name Date Dose VIS Date Route   Moderna COVID-19 Vaccine 06/02/2019 10:35 AM 0.5 mL 02/20/2019 Intramuscular   Manufacturer: Moderna   Lot: [id number]   NDC: 77939-688-64

## 2019-06-21 ENCOUNTER — Encounter: Payer: Self-pay | Admitting: Physician Assistant

## 2019-07-04 ENCOUNTER — Ambulatory Visit: Payer: BC Managed Care – PPO | Attending: Internal Medicine

## 2019-07-04 DIAGNOSIS — Z23 Encounter for immunization: Secondary | ICD-10-CM

## 2019-07-04 NOTE — Progress Notes (Signed)
   Covid-19 Vaccination Clinic  Name:  TARRIS DELBENE    MRN: [id number] DOB: 12-06-1960  07/04/2019  Mr. Colasanti was observed post Covid-19 immunization for 15 minutes without incident. He was provided with Vaccine Information Sheet and instruction to access the V-Safe system.   Mr. Langham was instructed to call 911 with any severe reactions post vaccine: Marland Kitchen Difficulty breathing  . Swelling of face and throat  . A fast heartbeat  . A bad rash all over body  . Dizziness and weakness   Immunizations Administered    Name Date Dose VIS Date Route   Moderna COVID-19 Vaccine 07/04/2019 10:05 AM 0.5 mL 02/20/2019 Intramuscular   Manufacturer: Moderna   Lot: [id number]   NDC: 16606-301-60

## 2022-04-15 ENCOUNTER — Encounter: Payer: Self-pay | Admitting: Internal Medicine

## 2022-04-15 ENCOUNTER — Ambulatory Visit: Payer: BC Managed Care – PPO | Admitting: Internal Medicine

## 2022-04-15 VITALS — BP 132/96 | HR 79 | Ht 71.0 in | Wt 212.0 lb

## 2022-04-15 DIAGNOSIS — R9431 Abnormal electrocardiogram [ECG] [EKG]: Secondary | ICD-10-CM

## 2022-04-15 DIAGNOSIS — I10 Essential (primary) hypertension: Secondary | ICD-10-CM

## 2022-04-15 DIAGNOSIS — I4891 Unspecified atrial fibrillation: Secondary | ICD-10-CM

## 2022-04-15 DIAGNOSIS — E782 Mixed hyperlipidemia: Secondary | ICD-10-CM

## 2022-04-15 MED ORDER — METOPROLOL SUCCINATE ER 25 MG PO TB24: 25.0000 mg | ORAL_TABLET | Freq: Every day | ORAL | 3 refills | Status: AC

## 2022-04-15 NOTE — Progress Notes (Signed)
Primary Physician/Referring:  Everardo Beals, NP  Patient ID: Marco Chapman, male    DOB: December 18, 1960, 62 y.o.   MRN: [id number]  Chief Complaint  Patient presents with   Atrial Fibrillation   New Patient (Initial Visit)   HPI:    Marco Chapman  is a 62 y.o. male with past medical history significant for hypertension and hyperlipidemia who is here to establish care with cardiology due to paroxysmal atrial fibrillation.  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  Today he is in normal sinus rhythm.  He denies chest pain, shortness of breath, palpitations, diaphoresis, syncope, edema, orthopnea, PND.  Patient is feeling well and he has never had any sort of cardiac arrhythmia in the past.  He is tolerating his medications without issue.  Patient does not have any symptoms with his paroxysmal A-fib and he is perfectly comfortable returning to work.  He has not had any sensations of feeling lightheaded or dizzy.  His heart rate is very well-controlled.  Past Medical History:  Diagnosis Date   Arrhythmia    Hypertension    Past Surgical History:  Procedure Laterality Date   BACK SURGERY     LUMBAR LAMINECTOMY/DECOMPRESSION MICRODISCECTOMY N/A 08/27/2016   Procedure: Decompression lumbar laminectomy L5-S1 right and microdisectomy L5-S1 right , forninotomy S1 root on the right;  Surgeon: Latanya Maudlin, MD;  Location: [facility];  Service: Orthopedics;  Laterality: N/A;   TOTAL HIP ARTHROPLASTY Right 01/28/2017   Procedure: RIGHT TOTAL HIP ARTHROPLASTY;  Surgeon: Latanya Maudlin, MD;  Location: [facility];  Service: Orthopedics;  Laterality: Right;   Family History  Problem Relation Age of Onset   Hypertension Mother    Hypertension Father    Hypertension Sister    Hypertension Brother    Hypertension Brother     Social History   Tobacco Use   Smoking status: Never   Smokeless  tobacco: Never  Substance Use Topics   Alcohol use: Yes    Comment: occas   Marital Status: Married  ROS  Review of Systems  Cardiovascular:  Negative for chest pain, claudication, dyspnea on exertion, irregular heartbeat, leg swelling, near-syncope, orthopnea, palpitations and syncope.   Objective  Blood pressure (!) 132/96, pulse 79, height 5\' 11"  (1.803 m), weight 212 lb (96.2 kg), SpO2 99 %. Body mass index is 29.57 kg/m.     04/15/2022    8:12 AM 01/30/2017    4:11 PM 01/30/2017    2:55 PM  Vitals with BMI  Height 5\' 11"     Weight 212 lbs    BMI 86.57    Systolic 846    Diastolic 96    Pulse 79 962 108     Physical Exam Vitals reviewed.  HENT:     Head: Normocephalic and atraumatic.  Cardiovascular:     Rate and Rhythm: Normal rate and regular rhythm.     Pulses: Normal pulses.     Heart sounds: Normal heart sounds. No murmur heard. Pulmonary:     Effort: Pulmonary effort is normal.     Breath sounds: Normal breath sounds.  Abdominal:     General: Bowel sounds are normal.  Musculoskeletal:     Right lower leg: No edema.     Left lower leg: No edema.  Skin:    General: Skin is warm and dry.  Neurological:  Mental Status: He is alert.     Medications and allergies  No Known Allergies   Medication list after today's encounter   Current Outpatient Medications:    amLODipine (NORVASC) 10 MG tablet, Take 10 mg by mouth every evening., Disp: , Rfl:    aspirin EC 81 MG tablet, Take 81 mg by mouth daily. Swallow whole., Disp: , Rfl:    losartan-hydrochlorothiazide (HYZAAR) 100-25 MG tablet, Take 1 tablet by mouth daily., Disp: , Rfl:    metoprolol succinate (TOPROL XL) 25 MG 24 hr tablet, Take 1 tablet (25 mg total) by mouth daily., Disp: 90 tablet, Rfl: 3   Multiple Vitamin (MULTIVITAMIN) tablet, Take 1 tablet by mouth daily., Disp: , Rfl:    rosuvastatin (CRESTOR) 10 MG tablet, Take 1 tablet by mouth daily., Disp: , Rfl:   Laboratory examination:   Lab  Results  Component Value Date   NA 135 01/30/2017   K 3.6 01/30/2017   CO2 25 01/30/2017   GLUCOSE 124 (H) 01/30/2017   BUN 21 (H) 01/30/2017   CREATININE 1.07 01/30/2017   CALCIUM 8.4 (L) 01/30/2017   GFRNONAA >60 01/30/2017       Latest Ref Rng & Units 01/30/2017    4:44 AM 01/29/2017    3:33 AM 01/20/2017    8:44 AM  CMP  Glucose 65 - 99 mg/dL 124  119  99   BUN 6 - 20 mg/dL 21  14  11    Creatinine 0.61 - 1.24 mg/dL 1.07  1.02  0.91   Sodium 135 - 145 mmol/L 135  139  142   Potassium 3.5 - 5.1 mmol/L 3.6  3.7  3.6   Chloride 101 - 111 mmol/L 101  105  108   CO2 22 - 32 mmol/L 25  28  25    Calcium 8.9 - 10.3 mg/dL 8.4  8.3  9.0   Total Protein 6.5 - 8.1 g/dL   7.2   Total Bilirubin 0.3 - 1.2 mg/dL   0.4   Alkaline Phos 38 - 126 U/L   57   AST 15 - 41 U/L   22   ALT 17 - 63 U/L   20       Latest Ref Rng & Units 01/30/2017    4:44 AM 01/29/2017    3:33 AM 01/20/2017    8:44 AM  CBC  WBC 4.0 - 10.5 K/uL 12.3  12.7  4.1   Hemoglobin 13.0 - 17.0 g/dL 11.0  11.8  14.2   Hematocrit 39.0 - 52.0 % 33.5  36.3  41.6   Platelets 150 - 400 K/uL 201  197  181     Lipid Panel No results for input(s): "CHOL", "TRIG", "LDLCALC", "VLDL", "HDL", "CHOLHDL", "LDLDIRECT" in the last 8760 hours.  HEMOGLOBIN A1C No results found for: "HGBA1C", "MPG" TSH No results for input(s): "TSH" in the last 8760 hours.  External labs:     Radiology:    Cardiac Studies:     EKG:   04/15/2022: Sinus Rhythm with iRBBB and left atrial enlargement.  Assessment     ICD-10-CM   1. Atrial fibrillation, paroxysmal  I48.91 EKG 12-Lead    [facility] ECHOCARDIOGRAM COMPLETE    LONG TERM MONITOR (3-14 DAYS)    2. Nonspecific abnormal electrocardiogram (ECG) (EKG)  R94.31 PCV ECHOCARDIOGRAM COMPLETE    LONG TERM MONITOR (3-14 DAYS)    3. Essential hypertension  I10     4. Mixed hyperlipidemia  E78.2  Orders Placed This Encounter  Procedures   LONG TERM MONITOR (3-14 DAYS)     Standing Status:   Future    Number of Occurrences:   1    Standing Expiration Date:   04/16/2023    Order Specific Question:   Where should this test be performed?    Answer:   [facility]    Order Specific Question:   Does the patient have an implanted cardiac device?    Answer:   No    Order Specific Question:   Prescribed days of wear    Answer:   13    Order Specific Question:   Type of enrollment    Answer:   Clinic Enrollment    Order Specific Question:   Release to patient    Answer:   Immediate   EKG 12-Lead   PCV ECHOCARDIOGRAM COMPLETE    Standing Status:   Future    Standing Expiration Date:   04/16/2023    Meds ordered this encounter  Medications   metoprolol succinate (TOPROL XL) 25 MG 24 hr tablet    Sig: Take 1 tablet (25 mg total) by mouth daily.    Dispense:  90 tablet    Refill:  3    Medications Discontinued During This Encounter  Medication Reason   methocarbamol (ROBAXIN) 500 MG tablet Completed Course   oxyCODONE-acetaminophen (PERCOCET/ROXICET) 5-325 MG tablet Completed Course   lisinopril (ZESTRIL) 10 MG tablet Completed Course   lisinopril (PRINIVIL,ZESTRIL) 5 MG tablet Completed Course   Ibuprofen-Famotidine (DUEXIS) 800-26.6 MG TABS    calcium carbonate (TUMS - DOSED IN MG ELEMENTAL CALCIUM) 500 MG chewable tablet Completed Course   hydrocortisone cream 1 %    aspirin EC 325 MG tablet Completed Course   Ibuprofen-Famotidine (DUEXIS) 800-26.6 MG TABS      Recommendations:   Marco Chapman is a 62 y.o.  male with PAF  Atrial fibrillation, paroxysmal CHA2DS2VASc = 1 and patient does not meet criteria for [facility] Will add low dose Toprol for added rate control Event monitor ordered to assess Afib burden   Nonspecific abnormal electrocardiogram (ECG) (EKG) Echocardiogram ordered   Essential hypertension Continue current cardiac medications. Encourage low-sodium diet, less than 2000 mg daily.   Mixed hyperlipidemia Continue  statin Primary following  Follow-up in 1-2 months or sooner if needed     Clotilde Dieter, DO, [facility]  04/15/2022, 10:12 AM Office: [phone] Pager: [phone]

## 2022-04-19 ENCOUNTER — Ambulatory Visit: Payer: BC Managed Care – PPO

## 2022-04-19 DIAGNOSIS — I4891 Unspecified atrial fibrillation: Secondary | ICD-10-CM

## 2022-04-19 DIAGNOSIS — R9431 Abnormal electrocardiogram [ECG] [EKG]: Secondary | ICD-10-CM

## 2022-04-19 NOTE — Progress Notes (Signed)
Normal

## 2022-04-19 NOTE — Progress Notes (Signed)
Called patient to inform him about his echo. Patient understood

## 2022-05-27 ENCOUNTER — Ambulatory Visit: Payer: BC Managed Care – PPO | Admitting: Internal Medicine

## 2022-06-03 ENCOUNTER — Encounter: Payer: Self-pay | Admitting: Internal Medicine

## 2022-06-03 ENCOUNTER — Ambulatory Visit: Payer: BC Managed Care – PPO | Admitting: Internal Medicine

## 2022-06-03 VITALS — BP 138/95 | HR 66 | Resp 16 | Ht 71.0 in | Wt 216.6 lb

## 2022-06-03 DIAGNOSIS — I4891 Unspecified atrial fibrillation: Secondary | ICD-10-CM

## 2022-06-03 DIAGNOSIS — I10 Essential (primary) hypertension: Secondary | ICD-10-CM

## 2022-06-03 DIAGNOSIS — E782 Mixed hyperlipidemia: Secondary | ICD-10-CM

## 2022-06-03 NOTE — Progress Notes (Signed)
Primary Physician/Referring:  Everardo Beals, NP  Patient ID: Marco Chapman, male    DOB: February 08, 1961, 62 y.o.   MRN: [id number]  Chief Complaint  Patient presents with   Atrial Fibrillation   Follow-up    6 weeks   HPI:    Marco Chapman  is a 62 y.o. male with past medical history significant for hypertension and hyperlipidemia who is here for a follow-up visit. Patient states that he has been feeling great since the last time he was here. He is tolerating metoprolol without issues. 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 Patient denies chest pain, shortness of breath, palpitations, diaphoresis, syncope, edema, PND, orthopnea.   Past Medical History:  Diagnosis Date   Arrhythmia    Hypertension    Past Surgical History:  Procedure Laterality Date   BACK SURGERY     LUMBAR LAMINECTOMY/DECOMPRESSION MICRODISCECTOMY N/A 08/27/2016   Procedure: Decompression lumbar laminectomy L5-S1 right and microdisectomy L5-S1 right , forninotomy S1 root on the right;  Surgeon: Latanya Maudlin, MD;  Location: [facility];  Service: Orthopedics;  Laterality: N/A;   TOTAL HIP ARTHROPLASTY Right 01/28/2017   Procedure: RIGHT TOTAL HIP ARTHROPLASTY;  Surgeon: Latanya Maudlin, MD;  Location: [facility];  Service: Orthopedics;  Laterality: Right;   Family History  Problem Relation Age of Onset   Hypertension Mother    Hypertension Father    Hypertension Sister    Hypertension Brother    Hypertension Brother     Social History   Tobacco Use   Smoking status: Never   Smokeless tobacco: Never  Substance Use Topics   Alcohol use: Yes    Comment: occas   Marital Status: Married  ROS  Review of Systems  Cardiovascular:  Negative for chest pain, claudication, dyspnea on exertion, irregular heartbeat, leg swelling, near-syncope, orthopnea, palpitations and syncope.   Objective  Blood pressure (!) 138/95, pulse 66, resp. rate 16, height '5\' 11"'$  (1.803 m), weight 216 lb 9.6  oz (98.2 kg), SpO2 99 %. Body mass index is 30.21 kg/m.     06/03/2022   11:22 AM 04/15/2022    8:12 AM 01/30/2017    4:11 PM  Vitals with BMI  Height '5\' 11"'$  '5\' 11"'$    Weight 216 lbs 10 oz 212 lbs   BMI [id number]   Systolic 0000000 [id number]   Diastolic 95 96   Pulse 66 79 101     Physical Exam Vitals reviewed.  HENT:     Head: Normocephalic and atraumatic.  Cardiovascular:     Rate and Rhythm: Normal rate and regular rhythm.     Pulses: Normal pulses.     Heart sounds: Normal heart sounds. No murmur heard. Pulmonary:     Effort: Pulmonary effort is normal.     Breath sounds: Normal breath sounds.  Abdominal:     General: Bowel sounds are normal.  Musculoskeletal:     Right lower leg: No edema.     Left lower leg: No edema.  Skin:    General: Skin is warm and dry.  Neurological:     Mental Status: He is alert.     Medications and allergies  No Known Allergies   Medication list after today's encounter   Current Outpatient Medications:    amLODipine (NORVASC) 10 MG tablet, Take 10 mg by mouth every evening., Disp: , Rfl:    aspirin EC 81 MG tablet, Take 81 mg by mouth daily. Swallow whole.,  Disp: , Rfl:    losartan-hydrochlorothiazide (HYZAAR) 100-25 MG tablet, Take 1 tablet by mouth daily., Disp: , Rfl:    metoprolol succinate (TOPROL XL) 25 MG 24 hr tablet, Take 1 tablet (25 mg total) by mouth daily., Disp: 90 tablet, Rfl: 3   Multiple Vitamin (MULTIVITAMIN) tablet, Take 1 tablet by mouth daily., Disp: , Rfl:    rosuvastatin (CRESTOR) 10 MG tablet, Take 1 tablet by mouth daily., Disp: , Rfl:   Laboratory examination:   Lab Results  Component Value Date   NA 135 01/30/2017   K 3.6 01/30/2017   CO2 25 01/30/2017   GLUCOSE 124 (H) 01/30/2017   BUN 21 (H) 01/30/2017   CREATININE 1.07 01/30/2017   CALCIUM 8.4 (L) 01/30/2017   GFRNONAA >60 01/30/2017       Latest Ref Rng & Units 01/30/2017    4:44 AM 01/29/2017    3:33 AM 01/20/2017    8:44 AM  CMP  Glucose 65 -  99 mg/dL 124  119  99   BUN 6 - 20 mg/dL '21  14  11   '$ Creatinine 0.61 - 1.24 mg/dL 1.07  1.02  0.91   Sodium 135 - 145 mmol/L 135  139  142   Potassium 3.5 - 5.1 mmol/L 3.6  3.7  3.6   Chloride 101 - 111 mmol/L 101  105  108   CO2 22 - 32 mmol/L '25  28  25   '$ Calcium 8.9 - 10.3 mg/dL 8.4  8.3  9.0   Total Protein 6.5 - 8.1 g/dL   7.2   Total Bilirubin 0.3 - 1.2 mg/dL   0.4   Alkaline Phos 38 - 126 U/L   57   AST 15 - 41 U/L   22   ALT 17 - 63 U/L   20       Latest Ref Rng & Units 01/30/2017    4:44 AM 01/29/2017    3:33 AM 01/20/2017    8:44 AM  CBC  WBC 4.0 - 10.5 K/uL 12.3  12.7  4.1   Hemoglobin 13.0 - 17.0 g/dL 11.0  11.8  14.2   Hematocrit 39.0 - 52.0 % 33.5  36.3  41.6   Platelets 150 - 400 K/uL 201  197  181     Lipid Panel No results for input(s): "CHOL", "TRIG", "LDLCALC", "VLDL", "HDL", "CHOLHDL", "LDLDIRECT" in the last 8760 hours.  HEMOGLOBIN A1C No results found for: "HGBA1C", "MPG" TSH No results for input(s): "TSH" in the last 8760 hours.  External labs:     Radiology:    Cardiac Studies:   Echocardiogram 04/19/2022:  Left ventricle cavity is normal in size. Moderate concentric hypertrophy  of the left ventricle. Normal global wall motion. Normal LV systolic  function with EF 53%. Indeterminate diastolic filling pattern.  Left atrial cavity is moderately dilated at 44.6 ml/m^2.  Mild to moderate mitral regurgitation.  Mild tricuspid regurgitation.  No evidence of pulmonary hypertension.    Zio Patch Extended Out Patient EKG monitoring 13 days starting 04/19/2022:   Dominant rhythm sinus rhythm. HR 43-203 bpm. Avg HR 81 bpm. Ventricular Bigeminy and Trigeminy were present.  Isolated SVE 4.1%, Couplet SVE <1.0%, Triplet SVE <1.0% Isolated VE 1.9%, Couplet VE <1.0%, Triplet VE <1.0% Atrial Fibrillation occurred (18% burden), ranging from 43-203 bpm (avg of 111 bpm), the longest lasting 5 hours 8 mins with an avg rate of 88 bpm.   EKG:    04/15/2022: Sinus Rhythm with iRBBB and left  atrial enlargement.  Assessment     ICD-10-CM   1. Essential hypertension  I10 Ambulatory referral to Cardiac Electrophysiology    2. Atrial fibrillation, unspecified type [facility])  I48.91 Ambulatory referral to Cardiac Electrophysiology    3. Mixed hyperlipidemia  E78.2 Ambulatory referral to Cardiac Electrophysiology       Orders Placed This Encounter  Procedures   Ambulatory referral to Cardiac Electrophysiology    Referral Priority:   Routine    Referral Type:   Consultation    Referral Reason:   Specialty Services Required    Requested Specialty:   Cardiology    Number of Visits Requested:   1    No orders of the defined types were placed in this encounter.   There are no discontinued medications.    Recommendations:   Marco Chapman is a 62 y.o.  male with PAF  Atrial fibrillation, paroxysmal CHA2DS2VASc = 1 and patient does not meet criteria for [facility] Continue Toprol for added rate control Event monitor shows Afib burden of 18% Echocardiogram within normal limits   Essential hypertension Continue current cardiac medications. BP slightly elevated today but at home he runs <130/80 Encourage low-sodium diet, less than 2000 mg daily.   Mixed hyperlipidemia Continue statin Primary following  Follow-up in 6 months or sooner if needed     Floydene Flock, DO, [facility]  06/03/2022, 1:05 PM Office: [phone] Pager: [phone]

## 2022-12-01 ENCOUNTER — Encounter: Payer: Self-pay | Admitting: Cardiology

## 2022-12-01 ENCOUNTER — Ambulatory Visit: Payer: BC Managed Care – PPO | Admitting: Cardiology

## 2022-12-01 VITALS — BP 139/80 | HR 79 | Resp 16 | Ht 71.0 in | Wt 228.2 lb

## 2022-12-01 DIAGNOSIS — R0683 Snoring: Secondary | ICD-10-CM

## 2022-12-01 DIAGNOSIS — I480 Paroxysmal atrial fibrillation: Secondary | ICD-10-CM

## 2022-12-01 NOTE — Progress Notes (Signed)
Follow up visit  Subjective:   Marco Chapman, male    DOB: 1960/09/06, 62 y.o.   MRN: [id number]   HPI  Chief Complaint  Patient presents with   Hypertension    62 y.o. African American male with hypertension, hyperlipidemia, PAF  Patient works with [employer] as a Hospital doctor, walks regularly without recurrence of chest pain, shortness of breath, palpitations.  On specific questioning, he does endorse snoring regularly.  He has never had sleep study.      Current Outpatient Medications:    amLODipine (NORVASC) 10 MG tablet, Take 10 mg by mouth every evening., Disp: , Rfl:    aspirin EC 81 MG tablet, Take 81 mg by mouth daily. Swallow whole., Disp: , Rfl:    losartan-hydrochlorothiazide (HYZAAR) 100-25 MG tablet, Take 1 tablet by mouth daily., Disp: , Rfl:    metoprolol succinate (TOPROL XL) 25 MG 24 hr tablet, Take 1 tablet (25 mg total) by mouth daily., Disp: 90 tablet, Rfl: 3   Multiple Vitamin (MULTIVITAMIN) tablet, Take 1 tablet by mouth daily., Disp: , Rfl:    rosuvastatin (CRESTOR) 10 MG tablet, Take 1 tablet by mouth daily., Disp: , Rfl:    Cardiovascular & other pertient studies:  Reviewed external labs and tests, independently interpreted  EKG 12/01/2022: Sinus rhythm 71 bpm Left atrial enlargement Occasional ectopic ventricular beat     Echocardiogram 04/19/2022:  Left ventricle cavity is normal in size. Moderate concentric hypertrophy  of the left ventricle. Normal global wall motion. Normal LV systolic  function with EF 53%. Indeterminate diastolic filling pattern.  Left atrial cavity is moderately dilated at 44.6 ml/m^2.  Mild to moderate mitral regurgitation.  Mild tricuspid regurgitation.  No evidence of pulmonary hypertension.   Zio Patch Extended Out Patient EKG monitoring 13 days starting 04/19/2022:   Dominant rhythm sinus rhythm. Atrial Fibrillation occurred (18% burden), ranging from 43-203 bpm (avg of 111 bpm), the longest lasting 5 hours 8 mins with an  avg rate of 88 bpm. HR 43-203 bpm. Avg HR 81 bpm. Ventricular Bigeminy and Trigeminy were present.  2 episodes of VT, fastest at 185 bpm for 7 beats, longest for 7 beats at 119 bpm. 1.9% isolated VE, <1% couplet/triplets. 0 episodes of SVT. 4.1% isolated SVE, <1% couplet/triplets. Isolated SVE 4.1%, Couplet SVE <1.0%, Triplet SVE <1.0% Isolated VE 1.9%, Couplet VE <1.0%, Triplet VE <1.0%    Recent labs: Not available   Review of Systems  Cardiovascular:  Negative for chest pain, dyspnea on exertion, leg swelling, palpitations and syncope.         Vitals:   12/01/22 0921  BP: 139/80  Pulse: 79  Resp: 16  SpO2: 97%    Body mass index is 31.83 kg/m. Filed Weights   12/01/22 0921  Weight: 228 lb 3.2 oz (103.5 kg)     Objective:   Physical Exam Vitals and nursing note reviewed.  Constitutional:      General: He is not in acute distress. Neck:     Vascular: No JVD.  Cardiovascular:     Rate and Rhythm: Normal rate and regular rhythm.     Heart sounds: Normal heart sounds. No murmur heard. Pulmonary:     Effort: Pulmonary effort is normal.     Breath sounds: Normal breath sounds. No wheezing or rales.  Musculoskeletal:     Right lower leg: No edema.     Left lower leg: No edema.  Visit diagnoses:   ICD-10-CM   1. PAF (paroxysmal atrial fibrillation) (HCC)  I48.0 EKG 12-Lead    Ambulatory referral to Sleep Studies    2. Snoring  R06.83 Ambulatory referral to Sleep Studies       Orders Placed This Encounter  Procedures   Ambulatory referral to Sleep Studies   EKG 12-Lead     Assessment & Recommendations:   62 y.o. African American male with hypertension, hyperlipidemia, PAF  PAF: Currently in sinus rhythm, no new symptoms. Continue current medications including metoprolol, losartan. CHA2DS2VASc = 1 and patient does not meet criteria for [facility].  Okay to continue aspirin 81 mg daily in absence of bleeding. Strongly recommend sleep  study.  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  I will see him as needed.      Elder Negus, MD Pager: [phone] Office: [phone]

## 2023-01-04 ENCOUNTER — Institutional Professional Consult (permissible substitution): Payer: BC Managed Care – PPO | Admitting: Neurology

## 2023-02-14 ENCOUNTER — Encounter: Payer: Self-pay | Admitting: Neurology

## 2023-02-14 ENCOUNTER — Ambulatory Visit (INDEPENDENT_AMBULATORY_CARE_PROVIDER_SITE_OTHER): Payer: BC Managed Care – PPO | Admitting: Neurology

## 2023-02-14 VITALS — BP 141/92 | HR 76 | Ht 71.0 in | Wt 224.0 lb

## 2023-02-14 DIAGNOSIS — Z9189 Other specified personal risk factors, not elsewhere classified: Secondary | ICD-10-CM | POA: Diagnosis not present

## 2023-02-14 DIAGNOSIS — R0681 Apnea, not elsewhere classified: Secondary | ICD-10-CM | POA: Diagnosis not present

## 2023-02-14 DIAGNOSIS — E66811 Obesity, class 1: Secondary | ICD-10-CM

## 2023-02-14 DIAGNOSIS — Z820 Family history of epilepsy and other diseases of the nervous system: Secondary | ICD-10-CM | POA: Diagnosis not present

## 2023-02-14 DIAGNOSIS — R0683 Snoring: Secondary | ICD-10-CM | POA: Diagnosis not present

## 2023-02-14 DIAGNOSIS — I480 Paroxysmal atrial fibrillation: Secondary | ICD-10-CM

## 2023-02-14 NOTE — Progress Notes (Signed)
Subjective:    Patient ID: Marco Chapman is a 62 y.o. male.  HPI    Huston Foley, MD, PhD [facility] [address], [address] [address] [place], [place] [address]  Dear Dr. Rosemary Holms,  I saw your patient, Marco Chapman, upon your kind request in my sleep clinic today for initial consultation for sleep disorder, in particular, concern for obstructive sleep apnea.  The patient is unaccompanied today.  As you know, Mr. Marco Chapman is a 62 year old male with an underlying medical history of hypertension, hyperlipidemia, paroxysmal atrial fibrillation, and obesity, who reports snoring and occasional breathing pauses while asleep per wife's observation.  He denies any gasping sensation while asleep.  He denies palpitations at night.  He does have nocturia about twice per average night.  His Epworth sleepiness score is 6 out of 24, fatigue severity score is 17 out of 63.  His older brother has sleep apnea.  He has a CDL.  He works for [employer], in  Furniture conservator/restorer.  He has worked with [employer] for 38 years and is planning to retire next year.  I reviewed your office note from 12/01/2022.  He had a Zio patch for about 2 weeks in January 2024.  He had episodes of A-fib.  He had ventricular bigeminy or trigeminy.  Echocardiogram from January 2024 showed left atrial dilatation and mild to moderate mitral regurgitation, mild tricuspid regurgitation.  Normal LV function with EF of 53%. His bedtime is generally around 10:30 PM and rise time around 6:30 AM.  He denies nocturnal or morning headaches.  He lives with his wife, no pets in the household, he has 1 grown stepson who lives in [place].  He is a non-smoker and drinks alcohol about once a week on Saturdays, caffeine in the form of soda, about 2 cans/day.  They do have a TV in the bedroom and sometimes he watches something before falling asleep, they do not keep the TV on all night.  His Past Medical History Is Significant For: Past  Medical History:  Diagnosis Date   Arrhythmia    Hypertension     His Past Surgical History Is Significant For: Past Surgical History:  Procedure Laterality Date   BACK SURGERY     LUMBAR LAMINECTOMY/DECOMPRESSION MICRODISCECTOMY N/A 08/27/2016   Procedure: Decompression lumbar laminectomy L5-S1 right and microdisectomy L5-S1 right , forninotomy S1 root on the right;  Surgeon: Ranee Gosselin, MD;  Location: [facility];  Service: Orthopedics;  Laterality: N/A;   TOTAL HIP ARTHROPLASTY Right 01/28/2017   Procedure: RIGHT TOTAL HIP ARTHROPLASTY;  Surgeon: Ranee Gosselin, MD;  Location: [facility];  Service: Orthopedics;  Laterality: Right;    His Family History Is Significant For: Family History  Problem Relation Age of Onset   Hypertension Mother    Hypertension Father    Hypertension Sister    Hypertension Brother    Hypertension Brother    Sleep apnea Neg Hx     His Social History Is Significant For: Social History   Socioeconomic History   Marital status: Married    Spouse name: Not on file   Number of children: Not on file   Years of education: Not on file   Highest education level: Not on file  Occupational History   Not on file  Tobacco Use   Smoking status: Never   Smokeless tobacco: Never  Vaping Use   Vaping status: Never Used  Substance and Sexual Activity   Alcohol use: Yes    Alcohol/week:  6.0 standard drinks of alcohol    Types: 6 Shots of liquor per week    Comment: occ   Drug use: No   Sexual activity: Not on file  Other Topics Concern   Not on file  Social History Narrative   Pt works at [employer]    At home with wife    Social Determinants of Corporate investment banker Strain: Not on file  Food Insecurity: Not on file  Transportation Needs: Not on file  Physical Activity: Not on file  Stress: Not on file  Social Connections: Not on file    His Allergies Are:  No Known Allergies:   His Current Medications Are:  Outpatient Encounter Medications as  of 02/14/2023  Medication Sig   amLODipine (NORVASC) 10 MG tablet Take 10 mg by mouth every evening.   aspirin EC 81 MG tablet Take 81 mg by mouth daily. Swallow whole.   losartan-hydrochlorothiazide (HYZAAR) 100-25 MG tablet Take 1 tablet by mouth daily.   metoprolol succinate (TOPROL XL) 25 MG 24 hr tablet Take 1 tablet (25 mg total) by mouth daily.   Multiple Vitamin (MULTIVITAMIN) tablet Take 1 tablet by mouth daily.   rosuvastatin (CRESTOR) 10 MG tablet Take 1 tablet by mouth daily.   No facility-administered encounter medications on file as of 02/14/2023.  :   Review of Systems:  Out of a complete 14 point review of systems, all are reviewed and negative with the exception of these symptoms as listed below:  Review of Systems  Neurological:        Pt here for sleep consult Pt snores,some fatigue,hypertension Pt denies sleep study,cpap machine,headaches    ESS:6 FSS:17    Objective:  Neurological Exam  Physical Exam Physical Examination:   Vitals:   02/14/23 0821  BP: (!) 141/92  Pulse: 76    General Examination: The patient is a very pleasant 62 y.o. male in no acute distress. He appears well-developed and well-nourished and well groomed.   HEENT: Normocephalic, atraumatic, pupils are equal, round and reactive to light, extraocular tracking is good without limitation to gaze excursion or nystagmus noted. Hearing is grossly intact. Face is symmetric with normal facial animation. Speech is clear with no dysarthria noted. There is no hypophonia. There is no lip, neck/head, jaw or voice tremor. Neck is supple with full range of passive and active motion. There are no carotid bruits on auscultation. Oropharynx exam reveals: mild mouth dryness, adequate dental hygiene and mild to moderate airway crowding, due to small airway entry, slightly longer uvula, tonsils about 1+.  Mallampati class II.  Neck circumference 16 three-quarter inches.  Tongue protrudes centrally and palate  elevates symmetrically, minimal overbite.    Chest: Clear to auscultation without wheezing, rhonchi or crackles noted.  Heart: S1+S2+0, regular and normal without murmurs, rubs or gallops noted.   Abdomen: Soft, non-tender and non-distended.  Extremities: There is no pitting edema in the distal lower extremities bilaterally.   Skin: Warm and dry without trophic changes noted.   Musculoskeletal: exam reveals no obvious joint deformities.   Neurologically:  Mental status: The patient is awake, alert and oriented in all 4 spheres. His immediate and remote memory, attention, language skills and fund of knowledge are appropriate. There is no evidence of aphasia, agnosia, apraxia or anomia. Speech is clear with normal prosody and enunciation. Thought process is linear. Mood is normal and affect is normal.  Cranial nerves II - XII are as described above under HEENT exam.  Motor exam: Normal bulk, strength and tone is noted. There is no obvious action or resting tremor.  Fine motor skills and coordination: grossly intact.  Cerebellar testing: No dysmetria or intention tremor. There is no truncal or gait ataxia.  Sensory exam: intact to light touch in the upper and lower extremities.  Gait, station and balance: He stands easily. No veering to one side is noted. No leaning to one side is noted. Posture is age-appropriate and stance is narrow based. Gait shows normal stride length and normal pace. No problems turning are noted.   Assessment and Plan:  In summary, TRAYVIN DAVIDE is a very pleasant 62 y.o.-year old male with an underlying medical history of hypertension, hyperlipidemia, paroxysmal atrial fibrillation, and obesity, whose history and physical exam are concerning for sleep disordered breathing, particularly obstructive sleep apnea (OSA). While a laboratory attended sleep study is typically considered "gold standard" for evaluation of sleep disordered breathing, we mutually agreed to  proceed with a home sleep test at this time.   I had a long chat with the patient about my findings and the diagnosis of sleep apnea, particularly OSA, its prognosis and treatment options. We talked about medical/conservative treatments, surgical interventions and non-pharmacological approaches for symptom control. I explained, in particular, the risks and ramifications of untreated moderate to severe OSA, especially with respect to developing cardiovascular disease down the road, including congestive heart failure (CHF), difficult to treat hypertension, cardiac arrhythmias (particularly A-fib), neurovascular complications including TIA, stroke and dementia. Even type 2 diabetes has, in part, been linked to untreated OSA. Symptoms of untreated OSA may include (but may not be limited to) daytime sleepiness, nocturia (i.e. frequent nighttime urination), memory problems, mood irritability and suboptimally controlled or worsening mood disorder such as depression and/or anxiety, lack of energy, lack of motivation, physical discomfort, as well as recurrent headaches, especially morning or nocturnal headaches. We talked about the importance of maintaining a healthy lifestyle and striving for healthy weight. In addition, we talked about the importance of striving for and maintaining good sleep hygiene. I recommended a sleep study at this time. I outlined the differences between a laboratory attended sleep study which is considered more comprehensive and accurate over the option of a home sleep test (HST); the latter may lead to underestimation of sleep disordered breathing in some instances and does not help with diagnosing upper airway resistance syndrome and is not accurate enough to diagnose primary central sleep apnea typically. I outlined possible surgical and non-surgical treatment options of OSA, including the use of a positive airway pressure (PAP) device (i.e. CPAP, AutoPAP/APAP or BiPAP in certain  circumstances), a custom-made dental device (aka oral appliance, which would require a referral to a specialist dentist or orthodontist typically, and is generally speaking not considered for patients with full dentures or edentulous state), upper airway surgical options, such as traditional UPPP (which is not considered a first-line treatment) or the Inspire device (hypoglossal nerve stimulator, which would involve a referral for consultation with an ENT surgeon, after careful selection, following inclusion criteria - also not first-line treatment). I explained the PAP treatment option to the patient in detail, as this is generally considered first-line treatment.  The patient indicated that he would be willing to try PAP therapy, if the need arises. I explained the importance of being compliant with PAP treatment, not only for insurance purposes but primarily to improve patient's symptoms symptoms, and for the patient's long term health benefit, including to reduce His cardiovascular risks longer-term.  We will pick up our discussion about the next steps and treatment options after testing.  We will keep him posted as to the test results by phone call and/or MyChart messaging where possible.  We will plan to follow-up in sleep clinic accordingly as well.  I answered all his questions today and the patient was in agreement.   I encouraged him to call with any interim questions, concerns, problems or updates or email [place] through MyChart.  Generally speaking, sleep test authorizations may take up to 2 weeks, sometimes less, sometimes longer, the patient is encouraged to get in touch with [place] if they do not hear back from the sleep lab staff directly within the next 2 weeks.  Thank you very much for allowing me to participate in the care of this nice patient. If I can be of any further assistance to you please do not hesitate to call me at [phone].  Sincerely,   Huston Foley, MD, PhD

## 2023-02-14 NOTE — Patient Instructions (Signed)

## 2023-02-22 ENCOUNTER — Other Ambulatory Visit: Payer: Self-pay | Admitting: Internal Medicine

## 2023-04-26 ENCOUNTER — Ambulatory Visit: Payer: BC Managed Care – PPO | Admitting: Neurology

## 2023-04-26 DIAGNOSIS — E66811 Obesity, class 1: Secondary | ICD-10-CM

## 2023-04-26 DIAGNOSIS — G4733 Obstructive sleep apnea (adult) (pediatric): Secondary | ICD-10-CM | POA: Diagnosis not present

## 2023-04-26 DIAGNOSIS — Z9189 Other specified personal risk factors, not elsewhere classified: Secondary | ICD-10-CM

## 2023-04-26 DIAGNOSIS — R0681 Apnea, not elsewhere classified: Secondary | ICD-10-CM

## 2023-04-26 DIAGNOSIS — Z820 Family history of epilepsy and other diseases of the nervous system: Secondary | ICD-10-CM

## 2023-04-26 DIAGNOSIS — G4734 Idiopathic sleep related nonobstructive alveolar hypoventilation: Secondary | ICD-10-CM

## 2023-04-26 DIAGNOSIS — I480 Paroxysmal atrial fibrillation: Secondary | ICD-10-CM

## 2023-04-26 DIAGNOSIS — R0683 Snoring: Secondary | ICD-10-CM

## 2023-04-27 NOTE — Progress Notes (Signed)
See procedure note.

## 2023-05-06 NOTE — Procedures (Signed)
 [facility]  HOME SLEEP TEST (Watch PAT) REPORT  STUDY DATE: 04/26/23  DOB: [birth year]  MRN: [id number]  ORDERING CLINICIAN: True Mar, MD, PhD   REFERRING CLINICIAN: Dr. CHRISTELLA. Patwardhan  CLINICAL INFORMATION/HISTORY: 63 year old male with an underlying medical history of hypertension, hyperlipidemia, paroxysmal atrial fibrillation, and obesity, who reports snoring and occasional breathing pauses while asleep.   Epworth sleepiness score: 6/24.  BMI: 31.2 kg/m  FINDINGS:   Sleep Summary:   Total Recording Time (hours, min): 7 hours, 15 min  Total Sleep Time (hours, min):  6 hours, 49 min  Percent REM (%):    8.2%   Respiratory Indices:   Calculated pAHI (per hour):  60.5/hour         REM pAHI:    53.2/hour       NREM pAHI: 61.1/hour  Central pAHI: 2.1/hour  Oxygen Saturation Statistics:    Oxygen Saturation (%) Mean: 92%   Minimum oxygen saturation (%):                 70%   O2 Saturation Range (%): 70-100%    O2 Saturation (minutes) <=88%: 48.7 min  Pulse Rate Statistics:   Pulse Mean (bpm):    57/min    Pulse Range (33 -84/min)   IMPRESSION: OSA (obstructive sleep apnea), severe Nocturnal Hypoxemia  RECOMMENDATION:  This home sleep test demonstrates severe obstructive sleep apnea with a total AHI of 60.5/hour and O2 nadir of 70% with significant time below or at 88% saturation of over 45 minutes for the study, indicating nocturnal hypoxemia. Snoring was detected, in the moderate to loud range for most of the study, at times milder.  Treatment with positive airway pressure is highly recommended. The patient will be advised to proceed with an autoPAP titration/trial at home. A laboratory attended titration study can be considered in the future for optimization of treatment settings and to improve tolerance and compliance, if needed, down the road. Alternative treatment options are limited secondary to the severity of the patient's sleep  disordered breathing, but may include surgical treatment with an implantable hypoglossal nerve stimulator (in carefully selected candidates, meeting criteria).  Concomitant weight loss is recommended (where clinically appropriate). Please note, that untreated obstructive sleep apnea may carry additional perioperative morbidity. Patients with significant obstructive sleep apnea should receive perioperative PAP therapy and the surgeons and particularly the anesthesiologist should be informed of the diagnosis and the severity of the sleep disordered breathing. The patient should be cautioned not to drive, work at heights, or operate dangerous or heavy equipment when tired or sleepy. Review and reiteration of good sleep hygiene measures should be pursued with any patient. Other causes of the patient's symptoms, including circadian rhythm disturbances, an underlying mood disorder, medication effect and/or an underlying medical problem cannot be ruled out based on this test. Clinical correlation is recommended.  The patient and his referring provider will be notified of the test results. The patient will be seen in follow up in sleep clinic at [facility]  I certify that I have reviewed the raw data recording prior to the issuance of this report in accordance with the standards of the American Academy of Sleep Medicine (AASM).    INTERPRETING PHYSICIAN:   True Mar, MD, PhD Medical Director, [facility]) Diplomat, ABPN (Neurology and Sleep)   [facility] [address], [address] [place], [place] [address] [phone]

## 2023-05-06 NOTE — Addendum Note (Signed)
Addended by: Huston Foley on: 05/06/2023 01:05 PM   Modules accepted: Orders

## 2023-05-09 ENCOUNTER — Telehealth: Payer: Self-pay | Admitting: *Deleted

## 2023-05-09 NOTE — Telephone Encounter (Signed)
I spoke with the patient and discussed his sleep study results as noted below by Dr. Frances Furbish.  The patient verbalized understanding and he is in agreement to start AutoPap.  He did state that he will need documentation to submitted to DOT by April 5 stating he has this treated.  I was able to schedule him for his initial follow-up on March 25 at 10:30 AM.  I let him know I would send the order urgently to [facility] (pt is ok with using them) and I encouraged him to follow-up within a couple of business days as well.  He was very Adult nurse.   Report sent to referring provider. Urgent order sent to [facility].

## 2023-05-09 NOTE — Telephone Encounter (Signed)
Zott, Marco Chapman, Otilio Jefferson, RN; [place], [place] We will do our best, order sent over as urgent thank You

## 2023-05-09 NOTE — Telephone Encounter (Signed)
-----   Message from Huston Foley sent at 05/06/2023  1:05 PM EST ----- Urgent set up requested on PAP therapy, due to severe OSA. Patient referred by cardiology, seen by me on 02/14/2023, patient had a HST on 04/26/2023.    Please call and notify the patient that the recent home sleep test showed obstructive sleep apnea in the severe range. I recommend treatment for this in the form of autoPAP, which means, that we don't have to bring him in for a sleep study with CPAP, but will let him start using a so called autoPAP machine at home, through a DME company (of his choice, or as per insurance requirement). The DME representative will fit the patient with a mask of choice, educate him on how to use the machine, how to put the mask on, etc. I have placed an order in the chart. Please send the order to a local DME, talk to patient, send report to referring MD. Please also reinforce the need for compliance with treatment. We will need a FU in sleep clinic for 10 weeks post-PAP set up, please arrange that with me or one of our NPs. Thanks,   Huston Foley, MD, PhD [facility])

## 2023-05-25 NOTE — Telephone Encounter (Signed)
 Pt was setup on auto-pap on 05/17/23. His f/u is 06/14/23. I confirmed with Stacy @ [facility] that his appt date should be fine regarding insurance compliance even though it will be at 28 days vs 30.   Zott, Linnell Fulling, [place], RN; [place], [place] Toma Copier  It will be fine to keep his appt that is already scheduled, his insurance is not strict with this.

## 2023-06-13 NOTE — Progress Notes (Unsigned)
 PATIENT: MASTER TOUCHET DOB: Oct 13, 1960  REASON FOR VISIT: follow up HISTORY FROM: patient PRIMARY NEUROLOGIST: Dr. Frances Furbish  Chief Complaint  Patient presents with   Room 19    Pt is here Alone. Pt states that things have been going great with his CPAP.      HISTORY OF PRESENT ILLNESS: Today 06/13/23:  BANYAN GOODCHILD is a 63 y.o. male with a history of obstructive sleep apnea on CPAP. Returns today for his initial CPAP follow-up. reports that CPAP is working well for him.  He can tell a difference since he started using it.  Reports that he still try to get used to using it.  His download is below.  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     HISTORY Mr. Quast is a 63 year old male with an underlying medical history of hypertension, hyperlipidemia, paroxysmal atrial fibrillation, and obesity, who reports snoring and occasional breathing pauses while asleep per wife's observation.  He denies any gasping sensation while asleep.  He denies palpitations at night.  He does have nocturia about twice per average night.  His Epworth sleepiness score is 6 out of 24, fatigue severity score is 17 out of 63.  His older brother has sleep apnea.  He has a CDL.  He works for [employer], in  Furniture conservator/restorer.  He has worked with [employer] for 38 years and is planning to retire next year.  I reviewed your office note from 12/01/2022.  He had a Zio patch for about 2 weeks in January 2024.  He had episodes of A-fib.  He had ventricular bigeminy or trigeminy.  Echocardiogram from January 2024 showed left atrial dilatation and mild to moderate mitral regurgitation, mild tricuspid regurgitation.  Normal LV function with EF of 53%. His bedtime is generally around 10:30 PM and rise time around 6:30 AM.  He denies nocturnal or morning headaches.  He lives with his wife, no pets in the household, he has 1 grown stepson who lives in [place].  He is a non-smoker and drinks alcohol about once a week on  Saturdays, caffeine in the form of soda, about 2 cans/day.  They do have a TV in the bedroom and sometimes he watches something before falling asleep, they do not keep the TV on all night.    REVIEW OF SYSTEMS: Out of a complete 14 system review of symptoms, the patient complains only of the following symptoms, and all other reviewed systems are negative.  FSS ESS  ALLERGIES: No Known Allergies  HOME MEDICATIONS: Outpatient Medications Prior to Visit  Medication Sig Dispense Refill   amLODipine (NORVASC) 10 MG tablet Take 10 mg by mouth every evening.     aspirin EC 81 MG tablet Take 81 mg by mouth daily. Swallow whole.     losartan-hydrochlorothiazide (HYZAAR) 100-25 MG tablet Take 1 tablet by mouth daily.     metoprolol succinate (TOPROL XL) 25 MG 24 hr tablet Take 1 tablet (25 mg total) by mouth daily. 90 tablet 3   Multiple Vitamin (MULTIVITAMIN) tablet Take 1 tablet by mouth daily.     rosuvastatin (CRESTOR) 10 MG tablet Take 1 tablet by mouth daily.     No facility-administered medications prior to visit.    PAST MEDICAL HISTORY: Past Medical History:  Diagnosis Date   Arrhythmia    Hypertension     PAST SURGICAL HISTORY: Past Surgical History:  Procedure Laterality Date   BACK SURGERY  LUMBAR LAMINECTOMY/DECOMPRESSION MICRODISCECTOMY N/A 08/27/2016   Procedure: Decompression lumbar laminectomy L5-S1 right and microdisectomy L5-S1 right , forninotomy S1 root on the right;  Surgeon: Ranee Gosselin, MD;  Location: [facility];  Service: Orthopedics;  Laterality: N/A;   TOTAL HIP ARTHROPLASTY Right 01/28/2017   Procedure: RIGHT TOTAL HIP ARTHROPLASTY;  Surgeon: Ranee Gosselin, MD;  Location: [facility];  Service: Orthopedics;  Laterality: Right;    FAMILY HISTORY: Family History  Problem Relation Age of Onset   Hypertension Mother    Hypertension Father    Hypertension Sister    Hypertension Brother    Hypertension Brother    Sleep apnea Neg Hx     SOCIAL  HISTORY: Social History   Socioeconomic History   Marital status: Married    Spouse name: Not on file   Number of children: Not on file   Years of education: Not on file   Highest education level: Not on file  Occupational History   Not on file  Tobacco Use   Smoking status: Never   Smokeless tobacco: Never  Vaping Use   Vaping status: Never Used  Substance and Sexual Activity   Alcohol use: Yes    Alcohol/week: 6.0 standard drinks of alcohol    Types: 6 Shots of liquor per week    Comment: occ   Drug use: No   Sexual activity: Not on file  Other Topics Concern   Not on file  Social History Narrative   Pt works at [employer]    At home with wife    Social Drivers of Corporate investment banker Strain: Not on file  Food Insecurity: Not on file  Transportation Needs: Not on file  Physical Activity: Not on file  Stress: Not on file  Social Connections: Not on file  Intimate Partner Violence: Not on file      PHYSICAL EXAM  Vitals:   06/14/23 1033  BP: 137/87  Pulse: 77  Weight: 213 lb (96.6 kg)  Height: 5\' 11"  (1.803 m)   Body mass index is 29.71 kg/m.  Generalized: Well developed, in no acute distress  Chest: Lungs clear to auscultation bilaterally  Neurological examination  Mentation: Alert oriented to time, place, history taking. Follows all commands speech and language fluent Cranial nerve II-XII: Facial symmetry noted   DIAGNOSTIC DATA (LABS, IMAGING, TESTING) - I reviewed patient records, labs, notes, testing and imaging myself where available.  Lab Results  Component Value Date   WBC 12.3 (H) 01/30/2017   HGB 11.0 (L) 01/30/2017   HCT 33.5 (L) 01/30/2017   MCV 88.2 01/30/2017   PLT 201 01/30/2017      Component Value Date/Time   NA 135 01/30/2017 0444   K 3.6 01/30/2017 0444   CL 101 01/30/2017 0444   CO2 25 01/30/2017 0444   GLUCOSE 124 (H) 01/30/2017 0444   BUN 21 (H) 01/30/2017 0444   CREATININE 1.07 01/30/2017 0444   CALCIUM 8.4 (L)  01/30/2017 0444   PROT 7.2 01/20/2017 0844   ALBUMIN 3.8 01/20/2017 0844   AST 22 01/20/2017 0844   ALT 20 01/20/2017 0844   ALKPHOS 57 01/20/2017 0844   BILITOT 0.4 01/20/2017 0844   GFRNONAA >60 01/30/2017 0444   GFRAA >60 01/30/2017 0444       ASSESSMENT AND PLAN 63 y.o. year old male  has a past medical history of Arrhythmia and Hypertension. here with:  OSA on CPAP  - CPAP compliance excellent - Good treatment of AHI  - Encourage  patient to use CPAP nightly and > 4 hours each night -A copy of his CPAP report was given to him for his DOT physical - F/U in 1 year or sooner if needed     Butch Penny, MSN, NP-C 06/13/2023, 1:38 PM [facility] [address] [place], [place] [address] [phone]

## 2023-06-13 NOTE — Progress Notes (Unsigned)
 Marland Kitchen

## 2023-06-14 ENCOUNTER — Telehealth: Payer: Self-pay | Admitting: Cardiology

## 2023-06-14 ENCOUNTER — Encounter: Payer: Self-pay | Admitting: Adult Health

## 2023-06-14 ENCOUNTER — Ambulatory Visit (INDEPENDENT_AMBULATORY_CARE_PROVIDER_SITE_OTHER): Payer: BC Managed Care – PPO | Admitting: Adult Health

## 2023-06-14 VITALS — BP 137/87 | HR 77 | Ht 71.0 in | Wt 213.0 lb

## 2023-06-14 DIAGNOSIS — G4733 Obstructive sleep apnea (adult) (pediatric): Secondary | ICD-10-CM

## 2023-06-14 NOTE — Telephone Encounter (Signed)
 Pt called in stating he is going to drop off Dot physical form to be filled out by Dr. Rosemary Holms. He asked that you let him know if he needs to do anything else as far as testing or scheduling an appt goes.

## 2023-06-14 NOTE — Patient Instructions (Signed)
 Continue using CPAP nightly and greater than 4 hours each night If your symptoms worsen or you develop new symptoms please let us know.

## 2023-06-14 NOTE — Telephone Encounter (Signed)
 Pt came and dropped off DOT Paperwork . Given to dr. Damian Leavell nurse Nehemiah Settle .

## 2023-06-21 ENCOUNTER — Encounter: Payer: Self-pay | Admitting: Cardiology

## 2023-06-21 ENCOUNTER — Telehealth (HOSPITAL_COMMUNITY): Payer: Self-pay

## 2023-06-21 ENCOUNTER — Ambulatory Visit: Attending: Cardiology | Admitting: Cardiology

## 2023-06-21 VITALS — BP 124/90 | HR 73 | Resp 16 | Ht 71.0 in | Wt 212.6 lb

## 2023-06-21 DIAGNOSIS — I480 Paroxysmal atrial fibrillation: Secondary | ICD-10-CM | POA: Diagnosis not present

## 2023-06-21 DIAGNOSIS — I10 Essential (primary) hypertension: Secondary | ICD-10-CM

## 2023-06-21 NOTE — Patient Instructions (Addendum)
 Testing/Procedures: Echo  Your physician has requested that you have an echocardiogram. Echocardiography is a painless test that uses sound waves to create images of your heart. It provides your doctor with information about the size and shape of your heart and how well your heart's chambers and valves are working. This procedure takes approximately one hour. There are no restrictions for this procedure. Please do NOT wear cologne, perfume, aftershave, or lotions (deodorant is allowed). Please arrive 15 minutes prior to your appointment time.  Please note: We ask at that you not bring children with you during ultrasound (echo/ vascular) testing. Due to room size and safety concerns, children are not allowed in the ultrasound rooms during exams. Our front office staff cannot provide observation of children in our lobby area while testing is being conducted. An adult accompanying a patient to their appointment will only be allowed in the ultrasound room at the discretion of the ultrasound technician under special circumstances. We apologize for any inconvenience.   Gxt  Exercise Tolerance Test  Please arrive 15 minutes prior to your appointment time for registration and insurance purposes.  The test will take approximately 45 minutes to complete.  How to prepare for your Exercise Stress Test: Do bring a list of your current medications with you.  If not listed below, you may take your medications as normal. HOLD METOPROLOL DAY OF TEST Do wear comfortable clothes (no dresses or overalls) and walking shoes, tennis shoes preferred (no heels or open toed shoes are allowed) Do Not wear cologne, perfume, aftershave or lotions (deodorant is allowed). Please report to [address], [address] for your test.  If these instructions are not followed, your test will have to be rescheduled.  If you have questions or concerns about your appointment, you can call the Stress Lab at [phone].  If you  cannot keep your appointment, please provide 24 hours notification to the Stress Lab, to avoid a possible $50 charge to your account.   Follow-Up: At [facility], you and your health needs are our priority.  As part of our continuing mission to provide you with exceptional heart care, our providers are all part of one team.  This team includes your primary Cardiologist (physician) and Advanced Practice Providers or APPs (Physician Assistants and Nurse Practitioners) who all work together to provide you with the care you need, when you need it.  Your next appointment:   1 year(s)  Provider:   Elder Negus, MD     We recommend signing up for the patient portal called "MyChart".  Sign up information is provided on this After Visit Summary.  MyChart is used to connect with patients for Virtual Visits (Telemedicine).  Patients are able to view lab/test results, encounter notes, upcoming appointments, etc.  Non-urgent messages can be sent to your provider as well.   To learn more about what you can do with MyChart, go to [email or web address].   Other Instructions      1st Floor: - Lobby - Registration  - Pharmacy  - Lab - Cafe  2nd Floor: - PV Lab - Diagnostic Testing (echo, CT, nuclear med)  3rd Floor: - Vacant  4th Floor: - TCTS (cardiothoracic surgery) - AFib Clinic - Structural Heart Clinic - Vascular Surgery  - Vascular Ultrasound  5th Floor: - HeartCare Cardiology (general and EP) - Clinical Pharmacy for coumadin, hypertension, lipid, weight-loss medications, and med management appointments    Valet parking services will be available as well.

## 2023-06-21 NOTE — Telephone Encounter (Signed)
 Detailed instructions left on the patient's answering machine. S.Jassiel Flye CCT

## 2023-06-21 NOTE — Progress Notes (Signed)
 Cardiology Office Note:  .   Date:  06/21/2023  ID:  Marco Chapman, DOB [birth year], MRN [id number] PCP: Elie Confer, NP  [facility] Providers Cardiologist:  Truett Mainland, MD PCP: Elie Confer, NP  Chief Complaint  Patient presents with   PAF     Marco Chapman is a 63 y.o. male with hypertension, hyperlipidemia, PAF, OSA on CPAP   Patient is doing really well.  He is feeling much more energetic since he started OSA for CPAP, but she is using diligently.  He has not noticed any symptoms of palpitations or A-fib.       Vitals:   06/21/23 1535  BP: (!) 124/90  Pulse: 73  Resp: 16  SpO2: 96%      Review of Systems  Cardiovascular:  Negative for chest pain, dyspnea on exertion, leg swelling, palpitations and syncope.        Studies Reviewed: Marland Kitchen    EKG 06/21/2023: Normal sinus rhythm Possible Left atrial enlargement When compared with ECG of 28-Jan-2017 11:00, Nonspecific T wave abnormality no longer evident in Lateral leads   Labs with PCP necxt week  Independently interpreted Echocardiogram 04/19/2022:  Moderate LVH.  EF 53%.  Indeterminate diastolic function. Moderate left atrial dilatation.  Mild to moderate mitral regurgitation. Mild tricuspid regurgitation.     Risk Assessment/Calculations:    CHA2DS2-VASc Score = 1  This indicates a 0.6% annual risk of stroke. The patient's score is based upon: CHF History: 0 HTN History: 1 Diabetes History: 0 Stroke History: 0 Vascular Disease History: 0 Age Score: 0 Gender Score: 0        Physical Exam Vitals and nursing note reviewed.  Constitutional:      General: He is not in acute distress. Neck:     Vascular: No JVD.  Cardiovascular:     Rate and Rhythm: Normal rate and regular rhythm.     Heart sounds: Normal heart sounds. No murmur heard. Pulmonary:     Effort: Pulmonary effort is normal.     Breath sounds: Normal breath sounds. No wheezing or rales.   Musculoskeletal:     Right lower leg: No edema.     Left lower leg: No edema.      VISIT DIAGNOSES:   ICD-10-CM   1. Essential hypertension  I10 EKG 12-Lead    2. PAF (paroxysmal atrial fibrillation) (HCC)  I48.0 ECHOCARDIOGRAM COMPLETE    Exercise Tolerance Test       Marco Chapman is a 63 y.o. male with hypertension, hyperlipidemia, PAF, OSA on CPAP   PAF: Currently in sinus rhythm, no new symptoms. Continue current medications including metoprolol, losartan. CHA2DS2VASc = 1 and patient does not meet criteria for OAC.  Okay to continue aspirin 81 mg daily in absence of bleeding. Continue OSA for CPAP.  I will obtain exercise treadmill stress test and echocardiogram as a part of continued management of PAF.  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  Hypertension: Diastolic blood pressure slightly better, but generally in low 80s.  Continue current antihypertensive medications.  No change made today.  Recommend low-salt diet.   Informed Consent   Shared Decision Making/Informed Consent The risks [chest pain, shortness of breath, cardiac arrhythmias, dizziness, blood pressure fluctuations, myocardial infarction, stroke/transient ischemic attack, and life-threatening complications (estimated to be 1 in 10,000)], benefits (risk stratification, diagnosing coronary artery disease, treatment guidance) and alternatives of an exercise tolerance test  were discussed in detail with [name] and he agrees to proceed.       No orders of the defined types were placed in this encounter.    F/u in 1 year  Signed, Elder Negus, MD

## 2023-06-22 ENCOUNTER — Other Ambulatory Visit (HOSPITAL_COMMUNITY)

## 2023-06-22 ENCOUNTER — Telehealth (HOSPITAL_COMMUNITY): Payer: Self-pay | Admitting: Cardiology

## 2023-06-22 ENCOUNTER — Encounter (HOSPITAL_COMMUNITY)

## 2023-06-22 NOTE — Telephone Encounter (Signed)
 Patient called and cancelled echo and GXT and will call back at a later date if still needed for DOT. Orders will be removed from the WQ and if patient calls back we will reinstate the orders. Thank you.

## 2023-06-23 NOTE — Telephone Encounter (Signed)
 Noted.  Thanks MJP

## 2023-09-20 ENCOUNTER — Telehealth (HOSPITAL_COMMUNITY): Payer: Self-pay

## 2023-09-20 NOTE — Telephone Encounter (Signed)
 Spoke with the patient, detailed instructions given. S.Icela Glymph CCT

## 2023-09-27 ENCOUNTER — Ambulatory Visit (HOSPITAL_COMMUNITY)
Admission: RE | Admit: 2023-09-27 | Discharge: 2023-09-27 | Disposition: A | Discharge status: Home / Self Care | Source: Ambulatory Visit | Attending: Cardiology | Admitting: Cardiology

## 2023-09-27 ENCOUNTER — Ambulatory Visit (HOSPITAL_BASED_OUTPATIENT_CLINIC_OR_DEPARTMENT_OTHER)
Admission: RE | Admit: 2023-09-27 | Discharge: 2023-09-27 | Disposition: A | Discharge status: Home / Self Care | Source: Ambulatory Visit | Attending: Cardiology | Admitting: Cardiology

## 2023-09-27 DIAGNOSIS — I517 Cardiomegaly: Secondary | ICD-10-CM | POA: Diagnosis not present

## 2023-09-27 DIAGNOSIS — I480 Paroxysmal atrial fibrillation: Secondary | ICD-10-CM | POA: Insufficient documentation

## 2023-09-27 DIAGNOSIS — I340 Nonrheumatic mitral (valve) insufficiency: Secondary | ICD-10-CM

## 2023-09-27 LAB — ECHOCARDIOGRAM COMPLETE
Area-P 1/2: 4.4 cm2
S' Lateral: 2.6 cm

## 2023-09-27 LAB — EXERCISE TOLERANCE TEST
Angina Index: 0
Base ST Depression (mm): 0 mm
Duke Treadmill Score: 9
Estimated workload: 10.1
Exercise duration (min): 8 min
Exercise duration (sec): 30 s
MPHR: 159 {beats}/min
Peak HR: 155 {beats}/min
Percent HR: 96 %
RPE: 18
Rest HR: 79 {beats}/min
ST Depression (mm): 0 mm

## 2023-09-29 ENCOUNTER — Ambulatory Visit: Payer: Self-pay | Admitting: Cardiology

## 2023-09-29 NOTE — Progress Notes (Signed)
 Reassuring stress test.  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  Thanks MJP

## 2023-09-29 NOTE — Progress Notes (Signed)
 Normal heart function.  A-fib related chamber dilation noted.  Thanks MJP

## 2023-09-30 NOTE — Telephone Encounter (Signed)
 Pt returning call to a nurse for results

## 2024-06-13 ENCOUNTER — Ambulatory Visit: Admitting: Adult Health

## 2024-06-18 ENCOUNTER — Ambulatory Visit: Admitting: Cardiology
